# Patient Record
Sex: Female | Born: 1985 | Race: White | Hispanic: No | Marital: Married | Smoking: Never smoker
Health system: Southern US, Community
[De-identification: ages and names within clinical notes are randomized; demographics above are authoritative.]

## PROBLEM LIST (undated history)

## (undated) DIAGNOSIS — Z1589 Genetic susceptibility to other disease: Secondary | ICD-10-CM

## (undated) DIAGNOSIS — Z789 Other specified health status: Secondary | ICD-10-CM

## (undated) HISTORY — PX: LASIK: SHX215

## (undated) HISTORY — PX: WISDOM TOOTH EXTRACTION: SHX21

## (undated) HISTORY — DX: Genetic susceptibility to other disease: Z15.89

## (undated) HISTORY — PX: BREAST ENHANCEMENT SURGERY: SHX7

---

## 2015-03-23 NOTE — L&D Delivery Note (Addendum)
Delivery Note At 3:07 AM a viable and healthy female was delivered via Vaginal, Spontaneous Delivery (Presentation:OA, vtx ;  ).  APGAR: 9, 9; weight 7 lb 9.3 oz (3440 g).   Placenta status:spontaneous, intact , not sent.  Cord: short  with the following complications: none.  Cord pH: none  Anesthesia:  epidural Episiotomy: Median done after quarter size superficial skin separation noted above anus Lacerations:  Partial 3rd degree; left Vaginal sulcus Repair of rectus sphincter muscle with 0-vicryl figure of  Eight sutures x 4 . The separation repaired with 4-0 vicryl subcuticular reinforce with interrupted 4-0 vicryl sutures  Suture Repair: 3.0 chromic vicryl 0- vicryl, 4-0 vicryl Est. Blood Loss (mL): 250  Mom to postpartum.  Baby to Couplet care / Skin to Skin.  Demaria Deeney A 12/07/2015, 5:01 AM

## 2015-11-28 ENCOUNTER — Encounter (HOSPITAL_COMMUNITY): Payer: Self-pay | Admitting: *Deleted

## 2015-11-28 ENCOUNTER — Inpatient Hospital Stay (HOSPITAL_COMMUNITY)
Admission: AD | Admit: 2015-11-28 | Discharge: 2015-11-28 | Disposition: A | Discharge status: Home / Self Care | Payer: BLUE CROSS/BLUE SHIELD | Source: Ambulatory Visit | Attending: Obstetrics & Gynecology | Admitting: Obstetrics & Gynecology

## 2015-11-28 DIAGNOSIS — O368130 Decreased fetal movements, third trimester, not applicable or unspecified: Secondary | ICD-10-CM | POA: Diagnosis not present

## 2015-11-28 DIAGNOSIS — Z3A38 38 weeks gestation of pregnancy: Secondary | ICD-10-CM | POA: Diagnosis present

## 2015-11-28 NOTE — MAU Provider Note (Signed)
  [facility]N MAU Attending Note  Subjective:  30 y.o. G1P0 at 2154w2d presents to MAU today for "decreased fetal movement". Was seen in office recently and was instructed on how to do kick counts; did not get 10 movements in 2 hours and she called the office. She was told to come here. ON evaluation, she reports adequate fetal movement, leaking of fluid, no contractions, and no vaginal bleeding,    Objective:  Blood pressure 109/73, pulse 80, temperature 98.2 F (36.8 C), temperature source Oral, resp. rate 16.  FHR tracing: 140 bpm, +moderate variability, +accels, no decels Toco: No contractions GENERAL: Well-developed, well-nourished female in no acute distress.  HEENT: Normocephalic, atraumatic.   LUNGS: Normal respiratory effort HEART: Regular rate noted SKIN: Warm, dry and without erythema ABDOMEN: Soft, nondistended, nontender PSYCH: Normal mood and affect   Assessment & Plan:  IUP at 7954w2d Reactive NST, patient reassured Also reassured about adequate fetal movements, kick counts are a guide for when movement is noted to be decreased.  Labor and fetal movement precautions advised. Discharged to home in stable condition and follow up in office as scheduled. Dr. Billy Coastaavon, [facility] Attending on Call, was notified about this patient's evaluation and discharge from MAU.    Rebekah CollinsUGONNA  Rebekah Manrique, MD, FACOG Attending Obstetrician & Gynecologist Faculty Practice, [facility] [place]

## 2015-11-28 NOTE — MAU Note (Signed)
Pt was seen in the office today and said she was measuring 34 weeks by fundal height her provider told her to keep up with movement and she started counting and got less than 10 kicks in two hours and was told to come in.  Denies LOF/VB.

## 2015-11-28 NOTE — Discharge Instructions (Signed)
Fetal Movement Counts  Patient Name: __________________________________________________ Patient Due Date: ____________________  Performing a fetal movement count is highly recommended in high-risk pregnancies, but it is good for every pregnant woman to do. Your health care provider may ask you to start counting fetal movements at 28 weeks of the pregnancy. Fetal movements often increase:  · After eating a full meal.  · After physical activity.  · After eating or drinking something sweet or cold.  · At rest.  Pay attention to when you feel the baby is most active. This will help you notice a pattern of your baby's sleep and wake cycles and what factors contribute to an increase in fetal movement. It is important to perform a fetal movement count at the same time each day when your baby is normally most active.   HOW TO COUNT FETAL MOVEMENTS  1. Find a quiet and comfortable area to sit or lie down on your left side. Lying on your left side provides the best blood and oxygen circulation to your baby.  2. Write down the day and time on a sheet of paper or in a journal.  3. Start counting kicks, flutters, swishes, rolls, or jabs in a 2-hour period. You should feel at least 10 movements within 2 hours.  4. If you do not feel 10 movements in 2 hours, wait 2-3 hours and count again. Look for a change in the pattern or not enough counts in 2 hours.  SEEK MEDICAL CARE IF:  · You feel less than 10 counts in 2 hours, tried twice.  · There is no movement in over an hour.  · The pattern is changing or taking longer each day to reach 10 counts in 2 hours.  · You feel the baby is not moving as he or she usually does.  Date: ____________ Movements: ____________ Start time: ____________ Finish time: ____________   Date: ____________ Movements: ____________ Start time: ____________ Finish time: ____________  Date: ____________ Movements: ____________ Start time: ____________ Finish time: ____________  Date: ____________ Movements:  ____________ Start time: ____________ Finish time: ____________  Date: ____________ Movements: ____________ Start time: ____________ Finish time: ____________  Date: ____________ Movements: ____________ Start time: ____________ Finish time: ____________  Date: ____________ Movements: ____________ Start time: ____________ Finish time: ____________  Date: ____________ Movements: ____________ Start time: ____________ Finish time: ____________   Date: ____________ Movements: ____________ Start time: ____________ Finish time: ____________  Date: ____________ Movements: ____________ Start time: ____________ Finish time: ____________  Date: ____________ Movements: ____________ Start time: ____________ Finish time: ____________  Date: ____________ Movements: ____________ Start time: ____________ Finish time: ____________  Date: ____________ Movements: ____________ Start time: ____________ Finish time: ____________  Date: ____________ Movements: ____________ Start time: ____________ Finish time: ____________  Date: ____________ Movements: ____________ Start time: ____________ Finish time: ____________   Date: ____________ Movements: ____________ Start time: ____________ Finish time: ____________  Date: ____________ Movements: ____________ Start time: ____________ Finish time: ____________  Date: ____________ Movements: ____________ Start time: ____________ Finish time: ____________  Date: ____________ Movements: ____________ Start time: ____________ Finish time: ____________  Date: ____________ Movements: ____________ Start time: ____________ Finish time: ____________  Date: ____________ Movements: ____________ Start time: ____________ Finish time: ____________  Date: ____________ Movements: ____________ Start time: ____________ Finish time: ____________   Date: ____________ Movements: ____________ Start time: ____________ Finish time: ____________  Date: ____________ Movements: ____________ Start time: ____________ Finish  time: ____________  Date: ____________ Movements: ____________ Start time: ____________ Finish time: ____________  Date: ____________ Movements: ____________ Start time:   ____________ Finish time: ____________  Date: ____________ Movements: ____________ Start time: ____________ Finish time: ____________  Date: ____________ Movements: ____________ Start time: ____________ Finish time: ____________  Date: ____________ Movements: ____________ Start time: ____________ Finish time: ____________   Date: ____________ Movements: ____________ Start time: ____________ Finish time: ____________  Date: ____________ Movements: ____________ Start time: ____________ Finish time: ____________  Date: ____________ Movements: ____________ Start time: ____________ Finish time: ____________  Date: ____________ Movements: ____________ Start time: ____________ Finish time: ____________  Date: ____________ Movements: ____________ Start time: ____________ Finish time: ____________  Date: ____________ Movements: ____________ Start time: ____________ Finish time: ____________  Date: ____________ Movements: ____________ Start time: ____________ Finish time: ____________   Date: ____________ Movements: ____________ Start time: ____________ Finish time: ____________  Date: ____________ Movements: ____________ Start time: ____________ Finish time: ____________  Date: ____________ Movements: ____________ Start time: ____________ Finish time: ____________  Date: ____________ Movements: ____________ Start time: ____________ Finish time: ____________  Date: ____________ Movements: ____________ Start time: ____________ Finish time: ____________  Date: ____________ Movements: ____________ Start time: ____________ Finish time: ____________  Date: ____________ Movements: ____________ Start time: ____________ Finish time: ____________   Date: ____________ Movements: ____________ Start time: ____________ Finish time: ____________  Date: ____________  Movements: ____________ Start time: ____________ Finish time: ____________  Date: ____________ Movements: ____________ Start time: ____________ Finish time: ____________  Date: ____________ Movements: ____________ Start time: ____________ Finish time: ____________  Date: ____________ Movements: ____________ Start time: ____________ Finish time: ____________  Date: ____________ Movements: ____________ Start time: ____________ Finish time: ____________  Date: ____________ Movements: ____________ Start time: ____________ Finish time: ____________   Date: ____________ Movements: ____________ Start time: ____________ Finish time: ____________  Date: ____________ Movements: ____________ Start time: ____________ Finish time: ____________  Date: ____________ Movements: ____________ Start time: ____________ Finish time: ____________  Date: ____________ Movements: ____________ Start time: ____________ Finish time: ____________  Date: ____________ Movements: ____________ Start time: ____________ Finish time: ____________  Date: ____________ Movements: ____________ Start time: ____________ Finish time: ____________     This information is not intended to replace advice given to you by your health care provider. Make sure you discuss any questions you have with your health care provider.     Document Released: 04/07/2006 Document Revised: 03/29/2014 Document Reviewed: 01/03/2012  Elsevier Interactive Patient Education ©2016 [employer]

## 2015-12-06 ENCOUNTER — Encounter (HOSPITAL_COMMUNITY): Payer: Self-pay | Admitting: *Deleted

## 2015-12-06 ENCOUNTER — Inpatient Hospital Stay (HOSPITAL_COMMUNITY)
Admission: AD | Admit: 2015-12-06 | Discharge: 2015-12-08 | DRG: 775 | Disposition: A | Discharge status: Home / Self Care | Payer: BLUE CROSS/BLUE SHIELD | Source: Ambulatory Visit | Attending: Obstetrics and Gynecology | Admitting: Obstetrics and Gynecology

## 2015-12-06 ENCOUNTER — Inpatient Hospital Stay (HOSPITAL_COMMUNITY): Payer: BLUE CROSS/BLUE SHIELD | Admitting: Anesthesiology

## 2015-12-06 DIAGNOSIS — IMO0001 Reserved for inherently not codable concepts without codable children: Secondary | ICD-10-CM

## 2015-12-06 DIAGNOSIS — O26893 Other specified pregnancy related conditions, third trimester: Secondary | ICD-10-CM | POA: Diagnosis present

## 2015-12-06 DIAGNOSIS — O4292 Full-term premature rupture of membranes, unspecified as to length of time between rupture and onset of labor: Principal | ICD-10-CM | POA: Diagnosis present

## 2015-12-06 DIAGNOSIS — Z6791 Unspecified blood type, Rh negative: Secondary | ICD-10-CM

## 2015-12-06 DIAGNOSIS — Z3A39 39 weeks gestation of pregnancy: Secondary | ICD-10-CM

## 2015-12-06 HISTORY — DX: Other specified health status: Z78.9

## 2015-12-06 LAB — POCT FERN TEST: POCT FERN TEST: POSITIVE

## 2015-12-06 LAB — CBC
HEMATOCRIT: 33.9 % — AB (ref 36.0–46.0)
Hemoglobin: 11.6 g/dL — ABNORMAL LOW (ref 12.0–15.0)
MCH: 29.3 pg (ref 26.0–34.0)
MCHC: 34.2 g/dL (ref 30.0–36.0)
MCV: 85.6 fL (ref 78.0–100.0)
PLATELETS: 163 10*3/uL (ref 150–400)
RBC: 3.96 MIL/uL (ref 3.87–5.11)
RDW: 13.8 % (ref 11.5–15.5)
WBC: 7.6 10*3/uL (ref 4.0–10.5)

## 2015-12-06 MED ORDER — LACTATED RINGERS IV SOLN: INTRAVENOUS | Status: DC

## 2015-12-06 MED ORDER — OXYTOCIN 40 UNITS IN LACTATED RINGERS INFUSION - SIMPLE MED
2.5000 [IU]/h | INTRAVENOUS | Status: DC
Start: 2015-12-06 — End: 2015-12-07
  Filled 2015-12-06: qty 1000

## 2015-12-06 MED ORDER — TERBUTALINE SULFATE 1 MG/ML IJ SOLN
0.2500 mg | Freq: Once | INTRAMUSCULAR | Status: DC | PRN
  Filled 2015-12-06: qty 1

## 2015-12-06 MED ORDER — ONDANSETRON HCL 4 MG/2ML IJ SOLN: 4.0000 mg | Freq: Four times a day (QID) | INTRAMUSCULAR | Status: DC | PRN

## 2015-12-06 MED ORDER — FLEET ENEMA 7-19 GM/118ML RE ENEM: 1.0000 | ENEMA | RECTAL | Status: DC | PRN

## 2015-12-06 MED ORDER — OXYTOCIN 40 UNITS IN LACTATED RINGERS INFUSION - SIMPLE MED: 2.5000 [IU]/h | INTRAVENOUS | Status: DC

## 2015-12-06 MED ORDER — OXYTOCIN BOLUS FROM INFUSION: 500.0000 mL | Freq: Once | INTRAVENOUS | Status: DC

## 2015-12-06 MED ORDER — OXYCODONE-ACETAMINOPHEN 5-325 MG PO TABS: 1.0000 | ORAL_TABLET | ORAL | Status: DC | PRN

## 2015-12-06 MED ORDER — ACETAMINOPHEN 325 MG PO TABS: 650.0000 mg | ORAL_TABLET | ORAL | Status: DC | PRN

## 2015-12-06 MED ORDER — FENTANYL 2.5 MCG/ML BUPIVACAINE 1/10 % EPIDURAL INFUSION (WH - ANES)
14.0000 mL/h | INTRAMUSCULAR | Status: DC | PRN
  Administered 2015-12-06 (×2): 14 mL/h via EPIDURAL

## 2015-12-06 MED ORDER — LACTATED RINGERS IV SOLN: 500.0000 mL | INTRAVENOUS | Status: DC | PRN

## 2015-12-06 MED ORDER — SOD CITRATE-CITRIC ACID 500-334 MG/5ML PO SOLN: 30.0000 mL | ORAL | Status: DC | PRN

## 2015-12-06 MED ORDER — LIDOCAINE HCL (PF) 1 % IJ SOLN
30.0000 mL | INTRAMUSCULAR | Status: DC | PRN
  Filled 2015-12-06: qty 30

## 2015-12-06 MED ORDER — EPHEDRINE 5 MG/ML INJ
10.0000 mg | INTRAVENOUS | Status: DC | PRN
  Filled 2015-12-06: qty 4

## 2015-12-06 MED ORDER — LACTATED RINGERS IV SOLN
INTRAVENOUS | Status: DC
  Administered 2015-12-06: 20:00:00 via INTRAVENOUS

## 2015-12-06 MED ORDER — PHENYLEPHRINE 40 MCG/ML (10ML) SYRINGE FOR IV PUSH (FOR BLOOD PRESSURE SUPPORT)
80.0000 ug | PREFILLED_SYRINGE | INTRAVENOUS | Status: DC | PRN
  Filled 2015-12-06: qty 5

## 2015-12-06 MED ORDER — OXYCODONE-ACETAMINOPHEN 5-325 MG PO TABS: 2.0000 | ORAL_TABLET | ORAL | Status: DC | PRN

## 2015-12-06 MED ORDER — DIPHENHYDRAMINE HCL 50 MG/ML IJ SOLN: 12.5000 mg | INTRAMUSCULAR | Status: DC | PRN

## 2015-12-06 MED ORDER — PHENYLEPHRINE 40 MCG/ML (10ML) SYRINGE FOR IV PUSH (FOR BLOOD PRESSURE SUPPORT)
PREFILLED_SYRINGE | INTRAVENOUS | Status: AC
  Filled 2015-12-06: qty 20

## 2015-12-06 MED ORDER — FENTANYL 2.5 MCG/ML BUPIVACAINE 1/10 % EPIDURAL INFUSION (WH - ANES)
INTRAMUSCULAR | Status: AC
  Filled 2015-12-06: qty 125

## 2015-12-06 MED ORDER — OXYTOCIN 10 UNIT/ML IJ SOLN: 10.0000 [IU] | Freq: Once | INTRAMUSCULAR | Status: DC

## 2015-12-06 MED ORDER — LACTATED RINGERS IV SOLN: 500.0000 mL | Freq: Once | INTRAVENOUS | Status: DC

## 2015-12-06 MED ORDER — LIDOCAINE HCL (PF) 1 % IJ SOLN
INTRAMUSCULAR | Status: DC | PRN
  Administered 2015-12-06 (×2): 7 mL via EPIDURAL

## 2015-12-06 MED ORDER — OXYTOCIN 40 UNITS IN LACTATED RINGERS INFUSION - SIMPLE MED
1.0000 m[IU]/min | INTRAVENOUS | Status: DC
  Administered 2015-12-06: 2 m[IU]/min via INTRAVENOUS

## 2015-12-06 NOTE — Anesthesia Pain Management Evaluation Note (Signed)
  [facility] Pain Management Visit Note  Patient: Rebekah Mclaughlin, 30 y.o., female  "Hello I am a member of the anesthesia team at [facility]. We have an anesthesia team available at all times to provide care throughout the hospital, including epidural management and anesthesia for C-section. I don't know your plan for the delivery whether it a natural birth, water birth, IV sedation, nitrous supplementation, doula or epidural, but we want to meet your pain goals."   1.Was your pain managed to your expectations on prior hospitalizations?   No prior hospitalizations  2.What is your expectation for pain management during this hospitalization?     Epidural  3.How can we help you reach that goal? Epidural when possible.  Record the patient's initial score and the patient's pain goal.   Pain: 1  Pain Goal: 5 [facility] wants you to be able to say your pain was always managed very well.  Samauri Kellenberger 12/06/2015

## 2015-12-06 NOTE — Progress Notes (Signed)
Dr. Cherly Hensenousins notified of pt in MAU.  Notified that pt is a G1P0 at 2144w3d.  Notified that pt came in for leaking of fluid.  Notified that pt is fern positive, 40, 80, -2, vertex, and GBS negative.  Provider states to put in routine admission orders.

## 2015-12-06 NOTE — MAU Note (Signed)
Pt states that about 5 hours ago she started leaking fluid.  Pt states it has been happening on and off ever since.  Pt states she has been feeling the baby move.  Pt states she is not contracting but is having some occasional cramps.

## 2015-12-06 NOTE — Anesthesia Preprocedure Evaluation (Signed)

## 2015-12-06 NOTE — H&P (Signed)
Rebekah Mclaughlin is a 30 y.o. female presenting@ term gestation with SROM clear fluid and labor OB History    Gravida Para Term Preterm AB Living   1             SAB TAB Ectopic Multiple Live Births                 Past Medical History:  Diagnosis Date  . Medical history non-contributory    Past Surgical History:  Procedure Laterality Date  . LASIK    . WISDOM TOOTH EXTRACTION     Family History: family history is not on file. Social History:  reports that she has never smoked. She has never used smokeless tobacco. She reports that she does not drink alcohol or use drugs.     Maternal Diabetes: No Genetic Screening: Normal Maternal Ultrasounds/Referrals: Normal Fetal Ultrasounds or other Referrals:  None Maternal Substance Abuse:  No Significant Maternal Medications:  None Significant Maternal Lab Results:  Lab values include: Group B Strep negative, Rh negative Other Comments:  FOB's brother with recessive trait( ? Disorder)  Review of Systems  All other systems reviewed and are negative.  Maternal Medical History:  Reason for admission: Rupture of membranes and contractions.   Contractions: Frequency: irregular and rare.    Fetal activity: Perceived fetal activity is normal.    Prenatal complications: no prenatal complications Prenatal Complications - Diabetes: none.    Dilation: 4 Effacement (%): 70 Station: -2 Exam by:: Javel Hersh  Blood pressure 119/77, pulse 66, temperature 98.5 F (36.9 C), temperature source Oral, resp. rate 16, height 5\' 10"  (1.778 m), weight 78 kg (172 lb). Maternal Exam:  Uterine Assessment: Contraction strength is mild.  Abdomen: Patient reports no abdominal tenderness. Fetal presentation: vertex  Introitus: Normal vulva. Ferning test: positive.  Amniotic fluid character: clear.  Pelvis: adequate for delivery.      Physical Exam  Constitutional: She is oriented to person, place, and time. She appears well-developed and  well-nourished.  HENT:  Head: Atraumatic.  Eyes: EOM are normal.  Neck: Neck supple.  Cardiovascular: Regular rhythm.   GI: Soft.  Musculoskeletal: She exhibits no edema.  Neurological: She is alert and oriented to person, place, and time.  Skin: Skin is warm and dry.  Psychiatric: She has a normal mood and affect.    Prenatal labs: ABO, Rh: --/--/O NEG (09/16 1956) Antibody: POS (09/16 1955) Rubella:  Immune RPR:  NR  HBsAg:   neg HIV:   neg GBS:   neg  Assessment/Plan: Labor RH negative SROM GBS cx neg P) admit. Routine labs. Pitocin. Analgesic prn   Rebekah Mclaughlin A 12/06/2015, 9:34 PM

## 2015-12-06 NOTE — Anesthesia Procedure Notes (Addendum)
Epidural Patient location during procedure: OB Start time: 12/06/2015 10:43 PM End time: 12/06/2015 10:47 PM  Staffing Anesthesiologist: Leilani AbleHATCHETT, Mirela Parsley Performed: anesthesiologist   Preanesthetic Checklist Completed: patient identified, surgical consent, pre-op evaluation, timeout performed, IV checked, risks and benefits discussed and monitors and equipment checked  Epidural Patient position: sitting Prep: site prepped and draped and DuraPrep Patient monitoring: continuous pulse ox and blood pressure Approach: midline Location: L3-L4 Injection technique: LOR air  Needle:  Needle type: Tuohy  Needle gauge: 17 G Needle length: 9 cm and 9 Needle insertion depth: 4 cm Catheter type: closed end flexible Catheter size: 19 Gauge Catheter at skin depth: 9 cm Test dose: negative and Other  Assessment Sensory level: T10 Events: blood not aspirated, injection not painful, no injection resistance, negative IV test and no paresthesia  Additional Notes Reason for block:procedure for pain

## 2015-12-07 ENCOUNTER — Encounter (HOSPITAL_COMMUNITY): Payer: Self-pay

## 2015-12-07 LAB — COMPREHENSIVE METABOLIC PANEL
ALBUMIN: 2.6 g/dL — AB (ref 3.5–5.0)
ALT: 15 U/L (ref 14–54)
ANION GAP: 6 (ref 5–15)
AST: 30 U/L (ref 15–41)
Alkaline Phosphatase: 163 U/L — ABNORMAL HIGH (ref 38–126)
BILIRUBIN TOTAL: 0.6 mg/dL (ref 0.3–1.2)
BUN: 10 mg/dL (ref 6–20)
CO2: 24 mmol/L (ref 22–32)
Calcium: 8.2 mg/dL — ABNORMAL LOW (ref 8.9–10.3)
Chloride: 106 mmol/L (ref 101–111)
Creatinine, Ser: 0.58 mg/dL (ref 0.44–1.00)
GFR calc Af Amer: >60 mL/min (ref >60)
GFR calc non Af Amer: >60 mL/min (ref >60)
GLUCOSE: 97 mg/dL (ref 65–99)
POTASSIUM: 3.2 mmol/L — AB (ref 3.5–5.1)
SODIUM: 136 mmol/L (ref 135–145)
Total Protein: 5.7 g/dL — ABNORMAL LOW (ref 6.5–8.1)

## 2015-12-07 LAB — CBC
HEMATOCRIT: 32.9 % — AB (ref 36.0–46.0)
HEMOGLOBIN: 11.3 g/dL — AB (ref 12.0–15.0)
MCH: 29.2 pg (ref 26.0–34.0)
MCHC: 34.3 g/dL (ref 30.0–36.0)
MCV: 85 fL (ref 78.0–100.0)
Platelets: 161 10*3/uL (ref 150–400)
RBC: 3.87 MIL/uL (ref 3.87–5.11)
RDW: 13.7 % (ref 11.5–15.5)
WBC: 14.9 10*3/uL — AB (ref 4.0–10.5)

## 2015-12-07 LAB — URIC ACID: Uric Acid, Serum: 4.9 mg/dL (ref 2.3–6.6)

## 2015-12-07 LAB — RPR: RPR Ser Ql: NONREACTIVE

## 2015-12-07 MED ORDER — ACETAMINOPHEN 325 MG PO TABS: 650.0000 mg | ORAL_TABLET | ORAL | Status: DC | PRN

## 2015-12-07 MED ORDER — ONDANSETRON HCL 4 MG/2ML IJ SOLN: 4.0000 mg | INTRAMUSCULAR | Status: DC | PRN

## 2015-12-07 MED ORDER — WITCH HAZEL-GLYCERIN EX PADS: 1.0000 "application " | MEDICATED_PAD | CUTANEOUS | Status: DC | PRN

## 2015-12-07 MED ORDER — SENNOSIDES-DOCUSATE SODIUM 8.6-50 MG PO TABS
2.0000 | ORAL_TABLET | ORAL | Status: DC
  Administered 2015-12-08: 2 via ORAL
  Filled 2015-12-07 (×2): qty 2

## 2015-12-07 MED ORDER — ONDANSETRON HCL 4 MG PO TABS: 4.0000 mg | ORAL_TABLET | ORAL | Status: DC | PRN

## 2015-12-07 MED ORDER — POLYETHYLENE GLYCOL 3350 17 G PO PACK
17.0000 g | PACK | Freq: Every day | ORAL | Status: DC
  Administered 2015-12-07 – 2015-12-08 (×2): 17 g via ORAL
  Filled 2015-12-07 (×4): qty 1

## 2015-12-07 MED ORDER — IBUPROFEN 600 MG PO TABS
600.0000 mg | ORAL_TABLET | Freq: Four times a day (QID) | ORAL | Status: DC
  Administered 2015-12-07: 600 mg via ORAL
  Filled 2015-12-07: qty 1

## 2015-12-07 MED ORDER — OXYCODONE HCL 5 MG PO TABS: 10.0000 mg | ORAL_TABLET | ORAL | Status: DC | PRN

## 2015-12-07 MED ORDER — DIPHENHYDRAMINE HCL 25 MG PO CAPS: 25.0000 mg | ORAL_CAPSULE | Freq: Four times a day (QID) | ORAL | Status: DC | PRN

## 2015-12-07 MED ORDER — DOCUSATE SODIUM 100 MG PO CAPS
100.0000 mg | ORAL_CAPSULE | Freq: Two times a day (BID) | ORAL | Status: DC
  Administered 2015-12-07 – 2015-12-08 (×3): 100 mg via ORAL
  Filled 2015-12-07 (×3): qty 1

## 2015-12-07 MED ORDER — IBUPROFEN 800 MG PO TABS
800.0000 mg | ORAL_TABLET | Freq: Three times a day (TID) | ORAL | Status: DC
  Administered 2015-12-07 – 2015-12-08 (×3): 800 mg via ORAL
  Filled 2015-12-07 (×3): qty 1

## 2015-12-07 MED ORDER — COCONUT OIL OIL
1.0000 "application " | TOPICAL_OIL | Status: DC | PRN
  Administered 2015-12-07: 1 via TOPICAL
  Filled 2015-12-07: qty 120

## 2015-12-07 MED ORDER — ERYTHROMYCIN 5 MG/GM OP OINT
TOPICAL_OINTMENT | OPHTHALMIC | Status: AC
  Filled 2015-12-07: qty 1

## 2015-12-07 MED ORDER — ZOLPIDEM TARTRATE 5 MG PO TABS: 5.0000 mg | ORAL_TABLET | Freq: Every evening | ORAL | Status: DC | PRN

## 2015-12-07 MED ORDER — DIBUCAINE 1 % RE OINT: 1.0000 "application " | TOPICAL_OINTMENT | RECTAL | Status: DC | PRN

## 2015-12-07 MED ORDER — OXYCODONE HCL 5 MG PO TABS: 5.0000 mg | ORAL_TABLET | ORAL | Status: DC | PRN

## 2015-12-07 MED ORDER — BENZOCAINE-MENTHOL 20-0.5 % EX AERO
1.0000 "application " | INHALATION_SPRAY | CUTANEOUS | Status: DC | PRN
  Administered 2015-12-07: 1 via TOPICAL
  Filled 2015-12-07: qty 56

## 2015-12-07 MED ORDER — PRENATAL MULTIVITAMIN CH
1.0000 | ORAL_TABLET | Freq: Every day | ORAL | Status: DC
  Administered 2015-12-07 – 2015-12-08 (×2): 1 via ORAL
  Filled 2015-12-07 (×2): qty 1

## 2015-12-07 MED ORDER — SIMETHICONE 80 MG PO CHEW: 80.0000 mg | CHEWABLE_TABLET | ORAL | Status: DC | PRN

## 2015-12-07 NOTE — Anesthesia Postprocedure Evaluation (Signed)
Anesthesia Post Note  Patient: Rebekah RaddleCarrie Dellis  Procedure(s) Performed: * No procedures listed *  Patient location during evaluation: Mother Baby Anesthesia Type: Epidural Level of consciousness: awake, awake and alert, oriented and patient cooperative Pain management: pain level controlled Vital Signs Assessment: post-procedure vital signs reviewed and stable Respiratory status: spontaneous breathing, nonlabored ventilation and respiratory function stable Cardiovascular status: stable Postop Assessment: no headache, no backache, no signs of nausea or vomiting, patient able to bend at knees and epidural receding Anesthetic complications: no     Last Vitals:  Vitals:   12/07/15 0545 12/07/15 0655  BP: 108/65 114/72  Pulse: 78 70  Resp: 16 16  Temp: 37 C 36.8 C    Last Pain:  Vitals:   12/07/15 0655  TempSrc: Oral  PainSc: 1    Pain Goal:                 Rube Sanchez L

## 2015-12-07 NOTE — Lactation Note (Signed)
This note was copied from a baby's chart. Lactation Consultation Note  Patient Name: Rebekah [name] ZOXWR'UToday's Date: 12/07/2015 Reason for consult: Follow-up assessment   Follow up for feeding assessment at mom's request. Mom is concerned about breast augmentation, inadequate breast tissue, and tongue tie. Rebekah Mclaughlin was STS with mom when I went into the room. He was not cueing to feed. We awakened him and assisted mom in latching him to the right breast in the cross cradle hold. Mom reports she was trying to latch him in the cradle hold, we reviewed importance of positioning to obtain a deep latch. [name] did latch after about 10 minutes and fed for 10 minutes and was still feeding when I left the room.   Mom with small breasts with breast augmentation history. Mom reports she had minimal changes with pregnancy. She is pumping and noted that she received a gtt of colostrum from the left breast earlier. 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 I was not able to visualize tongue tie but did note that Rebekah Mclaughlin extended his tongue over gumline and lip. 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 Mom noted that BF was tender and painful at times, showed her how to flange upper and lower lips. She reported that it felt better. Discussed trying NS if pain persists or not able to tolerate infant at breast.   Mom is pumping every 3 hours to stimulate supply, She was pleased to see a gtt of colostrum. Told her we would need to follow [name]'s weights closely and only time will tell if she will make an adequate supply. Enc her to maintain pumping after BF. Follow up tomorrow and prn.      Maternal Data Formula Feeding for Exclusion: No Has patient been taught Hand Expression?: Yes Does the patient have breastfeeding experience prior to this delivery?: No  Feeding Feeding Type: Breast Fed Length of feed: 10 min (Still feeding when I left the room)  LATCH  Score/Interventions Latch: Repeated attempts needed to sustain latch, nipple held in mouth throughout feeding, stimulation needed to elicit sucking reflex. Intervention(s): Adjust position;Assist with latch;Breast massage;Breast compression  Audible Swallowing: None  Type of Nipple: Everted at rest and after stimulation  Comfort (Breast/Nipple): Filling, red/small blisters or bruises, mild/mod discomfort  Problem noted: Mild/Moderate discomfort Interventions (Mild/moderate discomfort): Reverse pressue (flange lips)  Hold (Positioning): Assistance needed to correctly position infant at breast and maintain latch. Intervention(s): Breastfeeding basics reviewed;Support Pillows;Position options;Skin to skin  LATCH Score: 5  Lactation Tools Discussed/Used WIC Program: No Pump Review: Setup, frequency, and cleaning;Milk Storage Initiated by:: DW Date initiated:: 12/07/15   Consult Status Consult Status: Follow-up Date: 12/08/15 Follow-up type: In-patient    Silas FloodSharon S Mysti Haley 12/07/2015, 6:56 PM

## 2015-12-07 NOTE — Progress Notes (Signed)
Rebekah Mclaughlin is a 30 y.o. G1P0 at 231w4d by LMP admitted for active labor, rupture of membranes  Subjective: Chief Complaint  Patient presents with  . Rupture of Membranes    Objective: VS: BP 101/53 98 FHT:  FHR: 140 bpm, variability: moderate,  accelerations:  Present,  decelerations:  Absent UC:   regular, every 2-3 minutes SVE:   10 cm dilated, 100% effaced, +2 station Tracing: cat 1  Labs: Lab Results  Component Value Date   WBC 7.6 12/06/2015   HGB 11.6 (L) 12/06/2015   HCT 33.9 (L) 12/06/2015   MCV 85.6 12/06/2015   PLT 163 12/06/2015    Assessment / Plan: Augmentation of labor, progressing well P) start pushing  Anticipated MOD:  NSVD  Rebekah Mclaughlin A 12/07/2015, 4:44 AM

## 2015-12-07 NOTE — Lactation Note (Signed)
This note was copied from a baby's chart. Lactation Consultation Note: initial visit with mom, baby now 12 hours. Mom reports she has had a couple of pretty good feedings, attempting now but too sleepy.   Mom had breast augmentation- incision underneath breast. Reports she had very little breast tissue before surgery. Reports breast changes during pregnancy. Unable to hand express any Colostrum.   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 Baby asleep so I did not assess tongue at this time. Encouraged to discuss with Pediatrician.   Offered DEBP to promote milk supply. Mom agreeable. DEBP set up for mom- reviewed setup, use and cleaning of pump parts. Has Medela pump for home. Mom pumping as I left room. Mom states she really wants to breast feed. BF brochure given to mom. Reviewed our phone number, OP appointments and BFSG as resources for support after DC. To call for assist prn  Patient Name: Rebekah Reinaldo RaddleCarrie Doren ONGEX'BToday's Date: 12/07/2015 Reason for consult: Initial assessment;Breast surgery (Breast augmentation)   Maternal Data Formula Feeding for Exclusion: No Has patient been taught Hand Expression?: Yes Does the patient have breastfeeding experience prior to this delivery?: No  Feeding    LATCH Score/Interventions                      Lactation Tools Discussed/Used WIC Program: No Pump Review: Setup, frequency, and cleaning Initiated by:: Rebekah Mclaughlin Date initiated:: 12/07/15   Consult Status Consult Status: Follow-up Date: 12/08/15 Follow-up type: In-patient    Rebekah Mclaughlin, Rebekah Mclaughlin 12/07/2015, 3:28 PM

## 2015-12-08 MED ORDER — RHO D IMMUNE GLOBULIN 1500 UNIT/2ML IJ SOSY
300.0000 ug | PREFILLED_SYRINGE | Freq: Once | INTRAMUSCULAR | Status: AC
Start: 2015-12-08 — End: 2015-12-08
  Administered 2015-12-08: 300 ug via INTRAVENOUS
  Filled 2015-12-08: qty 2

## 2015-12-08 NOTE — Discharge Summary (Signed)
OB Discharge Summary  Patient Name: Rebekah Mclaughlin DOB: July 15, 1985 MRN: [id number]  Date of admission: 12/06/2015 Delivering MD: COUSINS, SHERONETTE   Date of discharge: 12/08/2015  Admitting diagnosis: 39 WKS, WATER BROKE Intrauterine pregnancy: 3368w4d     Secondary diagnosis:Principal Problem:   Postpartum care following vaginal delivery w 3rd (9/17) Active Problems:   Active labor  Additional problems: 3rd degree laceration    Discharge diagnosis: Term Pregnancy Delivered                                                                     Post partum procedures:none  Augmentation: Pitocin  Complications: None  Hospital course:  Induction of Labor With Vaginal Delivery   30 y.o. yo G1P1001 at 6768w4d was admitted to the hospital 12/06/2015 for induction of labor.  Indication for induction: PROM.  Patient had an uncomplicated labor course as follows: Membrane Rupture Time/Date: 1:00 PM ,12/06/2015   Intrapartum Procedures: Episiotomy: Median [2]                                         Lacerations:  3rd degree [4];Sulcus [9];Perineal [11];Vaginal [6]  Patient had delivery of a Viable infant.  Information for the patient's newborn:  Rebekah Mclaughlin, Boy Rebekah Mclaughlin [[id number]][[id number]]  Delivery Method: Vaginal, Spontaneous Delivery (Filed from Delivery Summary)   12/07/2015  Details of delivery can be found in separate delivery note.  Patient had a routine postpartum course. Patient is discharged home 12/08/15.   Physical exam Vitals:   12/07/15 0655 12/07/15 1040 12/07/15 1819 12/08/15 0500  BP: 114/72 107/68 107/66 110/68  Pulse: 70 (!) 58 70 (!) 57  Resp: 16 18 18 18   Temp: 98.3 F (36.8 C) 98.3 F (36.8 C) 98.7 F (37.1 C) 98 F (36.7 C)  TempSrc: Oral Oral Oral Oral  SpO2: 98%     Weight:      Height:       General: alert Lochia: appropriate Uterine Fundus: firm Incision: N/A DVT Evaluation: No evidence of DVT seen on physical exam. Labs: Lab Results  Component Value Date   WBC  14.9 (H) 12/07/2015   HGB 11.3 (L) 12/07/2015   HCT 32.9 (L) 12/07/2015   MCV 85.0 12/07/2015   PLT 161 12/07/2015   CMP Latest Ref Rng & Units 12/07/2015  Glucose 65 - 99 mg/dL 97  BUN 6 - 20 mg/dL 10  Creatinine 7.820.44 - 9.561.00 mg/dL 2.130.58  Sodium 086135 - 578145 mmol/L 136  Potassium 3.5 - 5.1 mmol/L 3.2(L)  Chloride 101 - 111 mmol/L 106  CO2 22 - 32 mmol/L 24  Calcium 8.9 - 10.3 mg/dL 8.2(L)  Total Protein 6.5 - 8.1 g/dL 4.6(N5.7(L)  Total Bilirubin 0.3 - 1.2 mg/dL 0.6  Alkaline Phos 38 - 126 U/L 163(H)  AST 15 - 41 U/L 30  ALT 14 - 54 U/L 15    Discharge instruction: per After Visit Summary and "Baby and Me Booklet".  After Visit Meds: PNV, stool softener  Diet: routine diet  Activity: Advance as tolerated. Pelvic rest for 6 weeks.   Outpatient follow up:2 weeks Follow up Appt:No future appointments. Follow up visit:  No Follow-up on file.  Postpartum contraception: Not Discussed  Newborn Data: Live born female  Birth Weight: 7 lb 9.3 oz (3440 g) APGAR: 9, 9  Baby Feeding: Breast Disposition:home with mother   12/08/2015 Lendon Colonel., MD

## 2015-12-10 LAB — TYPE AND SCREEN
ABO/RH(D): O NEG
Antibody Screen: POSITIVE
DAT, IGG: NEGATIVE
UNIT DIVISION: 0
Unit division: 0

## 2015-12-10 LAB — RH IG WORKUP (INCLUDES ABO/RH)
ABO/RH(D): O NEG
Fetal Screen: NEGATIVE
Gestational Age(Wks): 39.4
UNIT DIVISION: 0

## 2015-12-17 ENCOUNTER — Other Ambulatory Visit: Payer: Self-pay | Admitting: Obstetrics

## 2015-12-18 ENCOUNTER — Ambulatory Visit (HOSPITAL_COMMUNITY): Payer: BLUE CROSS/BLUE SHIELD | Admitting: Anesthesiology

## 2015-12-18 ENCOUNTER — Encounter (HOSPITAL_COMMUNITY): Payer: Self-pay

## 2015-12-18 ENCOUNTER — Encounter (HOSPITAL_COMMUNITY)
Admission: RE | Disposition: A | Discharge status: Home / Self Care | Payer: Self-pay | Source: Ambulatory Visit | Attending: Obstetrics

## 2015-12-18 ENCOUNTER — Ambulatory Visit (HOSPITAL_COMMUNITY)
Admission: RE | Admit: 2015-12-18 | Discharge: 2015-12-18 | Disposition: A | Discharge status: Home / Self Care | Payer: BLUE CROSS/BLUE SHIELD | Source: Ambulatory Visit | Attending: Obstetrics | Admitting: Obstetrics

## 2015-12-18 DIAGNOSIS — O901 Disruption of perineal obstetric wound: Secondary | ICD-10-CM | POA: Insufficient documentation

## 2015-12-18 HISTORY — PX: PERINEOPLASTY: SHX2218

## 2015-12-18 LAB — CBC
HEMATOCRIT: 42.3 % (ref 36.0–46.0)
HEMOGLOBIN: 14.4 g/dL (ref 12.0–15.0)
MCH: 29.7 pg (ref 26.0–34.0)
MCHC: 34 g/dL (ref 30.0–36.0)
MCV: 87.2 fL (ref 78.0–100.0)
Platelets: 408 10*3/uL — ABNORMAL HIGH (ref 150–400)
RBC: 4.85 MIL/uL (ref 3.87–5.11)
RDW: 13.9 % (ref 11.5–15.5)
WBC: 8.9 10*3/uL (ref 4.0–10.5)

## 2015-12-18 SURGERY — PERINEOPLASTY
Anesthesia: Monitor Anesthesia Care | Site: Vagina

## 2015-12-18 MED ORDER — SUFENTANIL CITRATE 50 MCG/ML IV SOLN: INTRAVENOUS | Status: DC | PRN

## 2015-12-18 MED ORDER — CEFAZOLIN SODIUM-DEXTROSE 2-3 GM-% IV SOLR
INTRAVENOUS | Status: DC | PRN
  Administered 2015-12-18: 2 g via INTRAVENOUS

## 2015-12-18 MED ORDER — FENTANYL CITRATE (PF) 100 MCG/2ML IJ SOLN: 25.0000 ug | INTRAMUSCULAR | Status: DC | PRN

## 2015-12-18 MED ORDER — CHLOROPROCAINE HCL 1 % IJ SOLN
INTRAMUSCULAR | Status: AC
  Filled 2015-12-18: qty 30

## 2015-12-18 MED ORDER — BUPIVACAINE LIPOSOME 1.3 % IJ SUSP
20.0000 mL | Freq: Once | INTRAMUSCULAR | Status: AC
  Administered 2015-12-18: 10 mL
  Filled 2015-12-18: qty 20

## 2015-12-18 MED ORDER — KETOROLAC TROMETHAMINE 30 MG/ML IJ SOLN
INTRAMUSCULAR | Status: AC
  Filled 2015-12-18: qty 1

## 2015-12-18 MED ORDER — LIDOCAINE HCL 1 % IJ SOLN
INTRAMUSCULAR | Status: AC
  Filled 2015-12-18: qty 20

## 2015-12-18 MED ORDER — METOCLOPRAMIDE HCL 5 MG/ML IJ SOLN: 10.0000 mg | Freq: Once | INTRAMUSCULAR | Status: DC | PRN

## 2015-12-18 MED ORDER — SCOPOLAMINE 1 MG/3DAYS TD PT72
1.0000 | MEDICATED_PATCH | Freq: Once | TRANSDERMAL | Status: DC
  Administered 2015-12-18: 1.5 mg via TRANSDERMAL

## 2015-12-18 MED ORDER — HYDROCODONE-ACETAMINOPHEN 7.5-325 MG PO TABS: 1.0000 | ORAL_TABLET | Freq: Once | ORAL | Status: DC | PRN

## 2015-12-18 MED ORDER — LACTATED RINGERS IV SOLN
INTRAVENOUS | Status: DC
  Administered 2015-12-18: 125 mL/h via INTRAVENOUS
  Administered 2015-12-18: 14:00:00 via INTRAVENOUS

## 2015-12-18 MED ORDER — MEPERIDINE HCL 25 MG/ML IJ SOLN: 6.2500 mg | INTRAMUSCULAR | Status: DC | PRN

## 2015-12-18 MED ORDER — FENTANYL CITRATE (PF) 100 MCG/2ML IJ SOLN
INTRAMUSCULAR | Status: AC
  Filled 2015-12-18: qty 2

## 2015-12-18 MED ORDER — PROPOFOL 10 MG/ML IV BOLUS
INTRAVENOUS | Status: AC
  Filled 2015-12-18: qty 40

## 2015-12-18 MED ORDER — LIDOCAINE HCL (CARDIAC) 20 MG/ML IV SOLN
INTRAVENOUS | Status: AC
  Filled 2015-12-18: qty 5

## 2015-12-18 MED ORDER — DEXAMETHASONE SODIUM PHOSPHATE 4 MG/ML IJ SOLN
INTRAMUSCULAR | Status: DC | PRN
  Administered 2015-12-18: 4 mg via INTRAVENOUS

## 2015-12-18 MED ORDER — MIDAZOLAM HCL 2 MG/2ML IJ SOLN
INTRAMUSCULAR | Status: AC
  Filled 2015-12-18: qty 2

## 2015-12-18 MED ORDER — ONDANSETRON HCL 4 MG/2ML IJ SOLN
INTRAMUSCULAR | Status: DC | PRN
  Administered 2015-12-18: 4 mg via INTRAVENOUS

## 2015-12-18 MED ORDER — CEFAZOLIN SODIUM-DEXTROSE 2-4 GM/100ML-% IV SOLN
INTRAVENOUS | Status: AC
  Filled 2015-12-18: qty 100

## 2015-12-18 MED ORDER — IBUPROFEN 800 MG PO TABS: 800.0000 mg | ORAL_TABLET | Freq: Three times a day (TID) | ORAL | 3 refills | Status: DC | PRN

## 2015-12-18 MED ORDER — MIDAZOLAM HCL 2 MG/2ML IJ SOLN
INTRAMUSCULAR | Status: DC | PRN
  Administered 2015-12-18 (×2): 1 mg via INTRAVENOUS

## 2015-12-18 MED ORDER — OXYCODONE-ACETAMINOPHEN 5-325 MG PO TABS: 1.0000 | ORAL_TABLET | ORAL | 0 refills | Status: DC | PRN

## 2015-12-18 MED ORDER — LIDOCAINE HCL 1 % IJ SOLN
INTRAMUSCULAR | Status: DC | PRN
  Administered 2015-12-18: 10 mL

## 2015-12-18 MED ORDER — FENTANYL CITRATE (PF) 100 MCG/2ML IJ SOLN
INTRAMUSCULAR | Status: DC | PRN
  Administered 2015-12-18: 25 ug via INTRAVENOUS
  Administered 2015-12-18 (×2): 50 ug via INTRAVENOUS
  Administered 2015-12-18: 75 ug via INTRAVENOUS

## 2015-12-18 MED ORDER — KETOROLAC TROMETHAMINE 30 MG/ML IJ SOLN
INTRAMUSCULAR | Status: DC | PRN
  Administered 2015-12-18: 30 mg via INTRAVENOUS

## 2015-12-18 MED ORDER — LIDOCAINE HCL (CARDIAC) 20 MG/ML IV SOLN
INTRAVENOUS | Status: DC | PRN
  Administered 2015-12-18: 40 mg via INTRAVENOUS

## 2015-12-18 MED ORDER — PROPOFOL 500 MG/50ML IV EMUL
INTRAVENOUS | Status: DC | PRN
  Administered 2015-12-18: 30 mg via INTRAVENOUS
  Administered 2015-12-18: 20 mg via INTRAVENOUS
  Administered 2015-12-18: 30 mg via INTRAVENOUS
  Administered 2015-12-18 (×2): 50 mg via INTRAVENOUS
  Administered 2015-12-18 (×2): 20 mg via INTRAVENOUS
  Administered 2015-12-18 (×2): 30 mg via INTRAVENOUS

## 2015-12-18 MED ORDER — SCOPOLAMINE 1 MG/3DAYS TD PT72
MEDICATED_PATCH | TRANSDERMAL | Status: AC
  Filled 2015-12-18: qty 1

## 2015-12-18 SURGICAL SUPPLY — 25 items
BLADE SURG 15 STRL LF C SS BP (BLADE) ×1 IMPLANT
BLADE SURG 15 STRL SS (BLADE) ×2
CATH ROBINSON RED A/P 16FR (CATHETERS) ×3 IMPLANT
CLOTH BEACON ORANGE TIMEOUT ST (SAFETY) ×3 IMPLANT
COUNTER NEEDLE 1200 MAGNETIC (NEEDLE) ×3 IMPLANT
ELECT REM PT RETURN 9FT ADLT (ELECTROSURGICAL) ×3
ELECTRODE REM PT RTRN 9FT ADLT (ELECTROSURGICAL) ×1 IMPLANT
GLOVE BIO SURGEON STRL SZ 6.5 (GLOVE) ×2 IMPLANT
GLOVE BIO SURGEONS STRL SZ 6.5 (GLOVE) ×1
GLOVE BIOGEL PI IND STRL 7.0 (GLOVE) ×2 IMPLANT
GLOVE BIOGEL PI INDICATOR 7.0 (GLOVE) ×4
GOWN STRL REUS W/TWL LRG LVL3 (GOWN DISPOSABLE) ×6 IMPLANT
NEEDLE HYPO 22GX1.5 SAFETY (NEEDLE) ×6 IMPLANT
NS IRRIG 1000ML POUR BTL (IV SOLUTION) ×3 IMPLANT
PACK VAGINAL MINOR WOMEN LF (CUSTOM PROCEDURE TRAY) ×3 IMPLANT
PAD OB MATERNITY 4.3X12.25 (PERSONAL CARE ITEMS) ×3 IMPLANT
PAD PREP 24X48 CUFFED NSTRL (MISCELLANEOUS) ×3 IMPLANT
PENCIL BUTTON HOLSTER BLD 10FT (ELECTRODE) ×3 IMPLANT
SUT VICRYL 3 0 RAPIDE (SUTURE) ×3 IMPLANT
SUT VICRYL RAPIDE 3 0 (SUTURE) ×3 IMPLANT
SUT VICRYL RAPIDE 4/0 PS 2 (SUTURE) ×3 IMPLANT
SYR 20CC LL (SYRINGE) ×3 IMPLANT
SYR CONTROL 10ML LL (SYRINGE) ×3 IMPLANT
TOWEL OR 17X24 6PK STRL BLUE (TOWEL DISPOSABLE) ×6 IMPLANT
WATER STERILE IRR 1000ML POUR (IV SOLUTION) IMPLANT

## 2015-12-18 NOTE — H&P (Signed)
CC: perineal separation  HPI: G1P1, 11 days out from SVD who presented for perineal check and found to have a distal perineal separation. Pt notes no constipation or blood in stool. Has otherwise been recovering well. Breastfeeding.  Past Medical History:  Diagnosis Date  . Medical history non-contributory     Past Surgical History:  Procedure Laterality Date  . LASIK    . WISDOM TOOTH EXTRACTION      All: none Meds: PNV, iron  PE: Vitals:   12/18/15 1231  BP: 102/75  Pulse: (!) 108  Resp: 16  Temp: 98.4 F (36.9 C)  TempSrc: Oral  SpO2: 98%  Weight: 68 kg (150 lb)  Height: 5\' 10"  (1.778 m)   GeN: well appearing, no distress Abd: soft, NT GU: def to OR  CBC    Component Value Date/Time   WBC 8.9 12/18/2015 1215   RBC 4.85 12/18/2015 1215   HGB 14.4 12/18/2015 1215   HCT 42.3 12/18/2015 1215   PLT 408 (H) 12/18/2015 1215   MCV 87.2 12/18/2015 1215   MCH 29.7 12/18/2015 1215   MCHC 34.0 12/18/2015 1215   RDW 13.9 12/18/2015 1215    A/P: PP perineal separation, for reapproximation  Rebekah Mclaughlin A. 12/18/2015 1:18 PM

## 2015-12-18 NOTE — Brief Op Note (Signed)
12/18/2015  2:30 PM  PATIENT:  Rebekah Mclaughlin  30 y.o. female  PRE-OPERATIVE DIAGNOSIS:  PERINEAL LACERATION SEQUELA  POST-OPERATIVE DIAGNOSIS:  PERINEAL LACERATION SEQUELA  PROCEDURE:  Procedure(s): Perineal revision of laceration separation (N/A) Repair of perineal dehiscence   SURGEON:  Surgeon(s) and Role:    * Noland FordyceKelly Ruslan Mccabe, MD - Primary  PHYSICIAN ASSISTANT:   ASSISTANTS: none   ANESTHESIA:   local and MAC  EBL:  Total I/O In: 1400 [I.V.:1400] Out: 25 [Blood:25]  BLOOD ADMINISTERED:none  DRAINS: none   LOCAL MEDICATIONS USED:  LIDOCAINE  and OTHER Exparel  SPECIMEN:  No Specimen  DISPOSITION OF SPECIMEN:  N/A  COUNTS:  YES  TOURNIQUET:  * No tourniquets in log *  DICTATION: .Dragon Dictation  PLAN OF CARE: home after PACU  PATIENT DISPOSITION:  PACU - hemodynamically stable.   Delay start of Pharmacological VTE agent (>24hrs) due to surgical blood loss or risk of bleeding: yes

## 2015-12-18 NOTE — Discharge Instructions (Signed)
Episiotomy, Care After Refer to this sheet in the next few weeks. These instructions provide you with information on caring for yourself after your procedure. Your health care provider may also give you more specific instructions. Your treatment has been planned according to current medical practices, but problems sometimes occur. Call your health care provider if you have any problems or questions after your procedure. WHAT TO EXPECT AFTER THE PROCEDURE After your procedure, it is typical to have the following sensations:  Pain or aching around the episiotomy site.  Redness and mild swelling around the episiotomy site.  A tugging or pulling sensation at the episiotomy site from the stitches. HOME CARE INSTRUCTIONS   The first day, put ice on the episiotomy area.  Put ice in a plastic bag.  Place a towel between your skin and the bag.  Leave the ice on for 20 minutes, 2-3 times a day.  Bathe using a warm sitz bath as directed by your health care provider. This can speed up healing. Sitz baths can be performed in your bathtub or using a sitz bath kit that fits over your toilet.  Place 3-4 inches of warm water in your bathtub or fill the sitz bath over-the-toilet container with warm water. Make sure the water is not too hot by placing a drop on your wrist.  Sit in the warm water for 20-30 minutes.  After bathing, pat your perineum dry with a clean towel. Do not scrub the perineum as this could cause pain, irritation, or open any stitches you may have.  Keep the over-the-toilet sitz bath container clean by rinsing it thoroughly after each use. Ask for help in keeping the bathtub clean with diluted bleach and water (2 tablespoons of bleach to one half gallon of water).  Repeat the sitz bath as often as you would like to relieve perineal pain, itching, or discomfort.  Apply a numbing spray to the episiotomy site as directed by your health care provider. This may help with  discomfort.  Wash your hands before and after applying medicine to the episiotomy area.  Put about 3 witch hazel-containing hemorrhoid treatment pads on top of your sanitary pad. The witch hazel in the hemorrhoid pads helps with discomfort and swelling.  Get a peri-bottle to squeeze warm water on your perineum when urinating, spraying the area from front to back. Pat the area to dry.  Sitting on an inflatable ring or pillow may provide comfort.  Only take over-the-counter or prescription medicines for pain, discomfort, or fever as directed by your health care provider.  Do not have sexual intercourse or use tampons until your health care provider says it is okay. Typically, you must wait at least 6 weeks.  Keep all postpartum appointments. SEEK MEDICAL CARE IF:   Your pain is not relieved with medicines.  You have painful urination.  You have a fever. SEEK IMMEDIATE MEDICAL CARE IF:   You have redness, swelling, or increasing pain in the episiotomy area.  You have pus coming from the episiotomy area.  You notice a bad smell coming from the episiotomy area.  Your episiotomy opens.  You notice swelling in the episiotomy area that is larger than when you left the hospital.  You cannot urinate.   This information is not intended to replace advice given to you by your health care provider. Make sure you discuss any questions you have with your health care provider.   Document Released: 03/08/2005 Document Revised: 03/29/2014 Document Reviewed: 12/12/2012 Elsevier Interactive  Patient Education 2016 [employer].

## 2015-12-18 NOTE — Transfer of Care (Signed)
Immediate Anesthesia Transfer of Care Note  Patient: Rebekah Mclaughlin  Procedure(s) Performed: Procedure(s): Perineal revision of laceration separation (N/A)  Patient Location: PACU  Anesthesia Type:MAC  Level of Consciousness: awake  Airway & Oxygen Therapy: Patient Spontanous Breathing  Post-op Assessment: Report given to RN  Post vital signs: Reviewed and stable  Last Vitals:  Vitals:   12/18/15 1500 12/18/15 1515  BP: 95/65 94/66  Pulse: (!) 59 62  Resp: 11 12  Temp:      Last Pain:  Vitals:   12/18/15 1515  TempSrc:   PainSc: Asleep      Patients Stated Pain Goal: 5 (12/18/15 1500)  Complications: No apparent anesthesia complications

## 2015-12-18 NOTE — Anesthesia Postprocedure Evaluation (Addendum)
Anesthesia Post Note  Patient: Reinaldo RaddleCarrie Louissaint  Procedure(s) Performed: Procedure(s) (LRB): Perineal revision of laceration separation (N/A)  Patient location during evaluation: PACU Anesthesia Type: MAC Level of consciousness: awake and alert and oriented Pain management: pain level controlled Vital Signs Assessment: post-procedure vital signs reviewed and stable Respiratory status: spontaneous breathing, nonlabored ventilation and respiratory function stable Cardiovascular status: blood pressure returned to baseline and stable Postop Assessment: no signs of nausea or vomiting Anesthetic complications: no     Last Vitals:  Vitals:   12/18/15 1500 12/18/15 1515  BP: 95/65 94/66  Pulse: (!) 59 62  Resp: 11 12  Temp:      Last Pain:  Vitals:   12/18/15 1500  TempSrc:   PainSc: 2    Pain Goal: Patients Stated Pain Goal: 5 (12/18/15 1500)               Rilley Stash A.

## 2015-12-18 NOTE — Anesthesia Preprocedure Evaluation (Addendum)
Anesthesia Evaluation  Patient identified by MRN, date of birth, ID band Patient awake    Reviewed: Allergy & Precautions, NPO status , Patient's Chart, lab work & pertinent test results  Airway Mallampati: II  TM Distance: >3 FB Neck ROM: Full    Dental no notable dental hx. (+) Teeth Intact   Pulmonary neg pulmonary ROS,    Pulmonary exam normal breath sounds clear to auscultation       Cardiovascular negative cardio ROS Normal cardiovascular exam Rhythm:Regular Rate:Normal     Neuro/Psych negative psych ROS   GI/Hepatic negative GI ROS, Neg liver ROS,   Endo/Other  negative endocrine ROS  Renal/GU negative Renal ROS  negative genitourinary   Musculoskeletal negative musculoskeletal ROS (+)   Abdominal   Peds  Hematology negative hematology ROS (+)   Anesthesia Other Findings   Reproductive/Obstetrics 3rd degree laceration from vaginal delivery 11 days ago. Now with partial dehiscence. Currently breastfeeding.                            CBC    Component Value Date/Time   WBC 8.9 12/18/2015 1215   RBC 4.85 12/18/2015 1215   HGB 14.4 12/18/2015 1215   HCT 42.3 12/18/2015 1215   PLT 408 (H) 12/18/2015 1215   MCV 87.2 12/18/2015 1215   MCH 29.7 12/18/2015 1215   MCHC 34.0 12/18/2015 1215   RDW 13.9 12/18/2015 1215    Anesthesia Physical Anesthesia Plan  ASA: I  Anesthesia Plan: MAC   Post-op Pain Management:    Induction:   Airway Management Planned: Natural Airway and Nasal Cannula  Additional Equipment:   Intra-op Plan:   Post-operative Plan:   Informed Consent: I have reviewed the patients History and Physical, chart, labs and discussed the procedure including the risks, benefits and alternatives for the proposed anesthesia with the patient or authorized representative who has indicated his/her understanding and acceptance.   Dental advisory given  Plan  Discussed with: Anesthesiologist, CRNA and Surgeon  Anesthesia Plan Comments:         Anesthesia Quick Evaluation

## 2015-12-18 NOTE — Op Note (Signed)
12/18/2015  2:30 PM  PATIENT:  Reinaldo Raddlearrie Kashani  30 y.o. female  PRE-OPERATIVE DIAGNOSIS:  PERINEAL LACERATION SEQUELA  POST-OPERATIVE DIAGNOSIS:  PERINEAL LACERATION SEQUELA  PROCEDURE:  Procedure(s): Perineal revision of laceration separation (N/A) Repair of perineal dehiscence   SURGEON:  Surgeon(s) and Role:    * Noland FordyceKelly Zariah Jost, MD - Primary  PHYSICIAN ASSISTANT:   ASSISTANTS: none   ANESTHESIA:   local and MAC  EBL:  Total I/O In: 1400 [I.V.:1400] Out: 25 [Blood:25]  BLOOD ADMINISTERED:none  DRAINS: none   LOCAL MEDICATIONS USED:  LIDOCAINE  and OTHER Exparel  SPECIMEN:  No Specimen  DISPOSITION OF SPECIMEN:  N/A  COUNTS:  YES  TOURNIQUET:  * No tourniquets in log *  DICTATION: .Dragon Dictation  PLAN OF CARE: home after PACU  PATIENT DISPOSITION:  PACU - hemodynamically stable.   Delay start of Pharmacological VTE agent (>24hrs) due to surgical blood loss or risk of bleeding: yes  Abx: 2g Ancef Complications: none Findings: separated distal perineum repair, intact sphincter muscle, no evidence infection  Indication: 11 days PP from SVD, exam noted dehiscence of perineal repair  Procedure: After informed consent obtained, pt taken to OR. 2g Ancef administered. MAC anesthesia given. Vaginal and rectal exam done, surgical planning carried out. Vaginal laceration intact. Proximal perineal laceration intact. Anal sphincter intact. Distal perineal laceration, about 4 cm not approximated with granulation tissue exposed.  Betadine prep. 20 cc 1% lidocaine injected. Using a pick up and scalped and then Metzenbaum scissors, the granulation tissue was debrided and removed. A 2 mm skin edge was included in the debridement, though the external skin appeared healthy.  Bovie cauter was used to hemostasis. Deep layer of 3-0 vircyl rapide, 4 figure of 8 sutures placed. 4-o vicryl then used for subcuticular closure with interrupted sutures. Exparel injected.   Sponge, lap and  needle count correct.  Pt tolerated well.  Kellen Hover A. 12/18/2015 2:39 PM

## 2015-12-18 NOTE — Anesthesia Postprocedure Evaluation (Signed)
Anesthesia Post Note  Patient: Rebekah RaddleCarrie Mclaughlin  Procedure(s) Performed: Procedure(s) (LRB): Perineal revision of laceration separation (N/A)  Patient location during evaluation: PACU Anesthesia Type: MAC Level of consciousness: awake and alert and oriented Pain management: pain level controlled Vital Signs Assessment: post-procedure vital signs reviewed and stable Respiratory status: spontaneous breathing, nonlabored ventilation and respiratory function stable Cardiovascular status: stable and blood pressure returned to baseline Postop Assessment: no signs of nausea or vomiting Anesthetic complications: no     Last Vitals:  Vitals:   12/18/15 1500 12/18/15 1515  BP: 95/65 94/66  Pulse: (!) 59 62  Resp: 11 12  Temp:      Last Pain:  Vitals:   12/18/15 1500  TempSrc:   PainSc: 2    Pain Goal: Patients Stated Pain Goal: 5 (12/18/15 1500)               Jrake Rodriquez A.

## 2015-12-19 ENCOUNTER — Encounter (HOSPITAL_COMMUNITY): Payer: Self-pay | Admitting: Obstetrics

## 2016-01-01 ENCOUNTER — Ambulatory Visit (HOSPITAL_COMMUNITY)
Admission: RE | Admit: 2016-01-01 | Discharge: 2016-01-01 | Disposition: A | Discharge status: Home / Self Care | Payer: BLUE CROSS/BLUE SHIELD | Source: Ambulatory Visit | Attending: Obstetrics | Admitting: Obstetrics

## 2016-01-01 NOTE — Lactation Note (Signed)
Lactation Consult  Mother's reason for visit:  Per mom difficulty with latch  Visit Type: feeding assessment  Appointment Notes: Frenotomy done ( lip and tongue) Fissure at the base of the nipple developed  Pump and /bottle . Has only gained 5 oz in 2 weeks. Low milk supply ? Marland Kitchen Mom wants to work on positioning  And wants to bring pump. Confirmed for 11/12  Consult:  Initial Lactation Consultant:  Kathrin Greathouse  ________________________________________________________________________ Rebekah Mclaughlin Name:  Rebekah Mclaughlin Date of Birth:  12/07/2016 Pediatrician:  Dr. Jerrell Mylar  Gender:  female Gestational Age: [redacted]w[redacted]d (At Birth) Birth Weight:  7 lb 9.3 oz (3440 g) Weight at Discharge:  Weight: 7 lb 3 oz (3260 g)                  Date of Discharge:  12/08/2015     Filed Weights   12/07/15 0307 12/08/15 0000  Weight: 7 lb 9.3 oz (3440 g) 7 lb 3 oz (3260 g)  Last weight taken from [facility]:  8.8.5 oz 10/11 at     Location:Pediatrician's office Weight today: 8-11.0 oz , 3940 g     ________________________________________________________________________  Mother's Name: Rebekah Mclaughlin Type of delivery:  Vaginal Delivery  Breastfeeding Experience: per mom 1st baby , Maternal Medical Conditions:  Breast augmentation ( per mom prior to implants , had limited breast tissue, and just a few breast changes with pregnancy. When milk came in did not experience any engorgement. 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 When Rebekah Mclaughlin wasn't gaining weight properly  New combination feeding ( breast feeding and supplementing, post pumping with a DEBP Medela #24 Flange ( which is comfortable). With 1 2- oz volume. Per mom currently feeding every 2.5 -3 hours. I'm only breastfeeding during the day and  pumping at night. We are also supplementing with formula  2 oz if EBM isn't available after every feeding . Or if Rebekah Mclaughlin is just receiving a bottle for feeding ( taking 4- 5 oz from Dr. Manson Passey board based nipple) Duration of feeding:  Depends - no real consistency Per mom Monday attended the [facility] due to a right sore nipple under the nipple ( like a fissure ). Per mom received assistance from the [facility]  And it has got'en  Better. Still present , but healing.  Per mom milk came in at 5 days without engorgement.  Last fed at 3:30 pm 30 ml    Infant Intake and Output Assessment  Voids:  >6  in 24 hrs.  Color:  Clear yellow Stools:  >2-3  in 24 hrs.  Color:  Brown and Yellow  ________________________________________________________________________  Maternal Breast Assessment  Breast:  Soft Nipple:  Erect Pain level:  0 Pain interventions:  Expressed breast milk  _______________________________________________________________________ Feeding Assessment/Evaluation  Initial feeding assessment:  Infant's oral assessment:  Frenotomy ( labial ( healed ) and anterior - almost healed. LC noted upper lip flanges well with latch and when examined by [facility] with a gloved finger. Baby has mobility of tongue. ( per mom Frenotomy was done at 80  days old - Dr. Margaretha Sheffield -[facility]  Without problems of healing. And baby has tolerated exercises well, post.   Positioning:  Cross cradle Right breast  LATCH documentation:  Latch:  2 = Grasps breast easily, tongue down, lips flanged, rhythmical sucking.  Audible swallowing:  1 = A few with stimulation  Type of nipple:  2 = Everted at rest and after stimulation  Comfort (Breast/Nipple):  2 = Soft / non-tender  Hold (Positioning):  2 = No assistance needed to correctly position infant at breast  LATCH score:  9   Attached assessment:  Deep  Lips flanged:  Yes.    Lips untucked:  Yes.    Suck assessment:  Nutritive and Nonnutritive  LC noted nutritive at 1st and  a lot of hanging out non - nutritive feeding behavior.  LC added a Double SNS and had mom take him off briefly to attach . Baby tolerated  Well and fed for 20 plus minutes in a active participating pattern , pausing intermittently. Baby took the 2 oz in the SNS and also 2 oz from a bottle ( dad fed the baby) using the  Broader Dr. Manson PasseyBrown nipple. ( LC recommended it would help Rebekah Mclaughlin flange upper lip and lower With the smaller DR. Brown nipple.   Tools:  Supplemental nutrition system Instructed on use and cleaning of tool:  Yes.    Pre-feed weight: 3940 g , 8-11.0 oz  Post-feed weight:  4006 g , 8 - 13.3 oz  Amount transferred:  ( breast milk ) - 6 ml  Amount supplemented:  60 ml ( SNS )   Additional feeding was from a bottle = 2 oz   Total amount pumped post feed:  R- 5 ml ( breast baby had fed off )   L 25  Ml ( 30 ml total )   Total amount transferred: ( breast milk @  BREAST ) = 6 ML  Total supplement given:  120 ML ( FORMULA )   Lactation Impression:  434 week old Rebekah Mclaughlin's weight has increased since Tuesday's weight at the Dr. By 3 plus oz  Parents are consistently supplementing with a bottle after breast feeding. Mom aware her milk supply is low and discussed at consults ways to increase ( see plan below)  Mom is very motivated - appears at the consult to be very tired, although she mentioned she is  Getting 3 hours stretches of rest 2-3 times in 24 hours . Rebekah Mclaughlin still likes being up at night.   When Darden RestaurantsHenry latched , obtained depth well, but does a lot of non - nutritive feeding ,  Added the SNS , and improved.   Lactation Plan Of Care:  Focus on what you can give your baby not what you can't - Breast milk wise Goals - to increase milk supply - google low milk supply and follow hand out provided and discussed Start Lactation support with 5 herbs to increase supply  Plenty of fluids , esp. Water, nutritious snacks and meals  Rest and naps  Consider power pumping once a day over 1  hour as discussed  If at all possible use the SNS at the breast ( both ) for 20 mins each - can skip pumping at that feeding  Bottle nipple - smaller Dr. Manson PasseyBrown  - sue to supplement if not using the SNS.  Extra pumping - after 5-6 feedings a day 10 -20 mins - reminder center bottles and then don't watch them

## 2016-01-06 ENCOUNTER — Ambulatory Visit (HOSPITAL_COMMUNITY): Admission: RE | Admit: 2016-01-06 | Payer: BLUE CROSS/BLUE SHIELD | Source: Ambulatory Visit

## 2017-11-18 LAB — OB RESULTS CONSOLE HEPATITIS B SURFACE ANTIGEN: Hepatitis B Surface Ag: NEGATIVE

## 2017-11-18 LAB — OB RESULTS CONSOLE ABO/RH: RH Type: NEGATIVE

## 2017-11-18 LAB — OB RESULTS CONSOLE RUBELLA ANTIBODY, IGM: RUBELLA: IMMUNE

## 2017-11-18 LAB — OB RESULTS CONSOLE RPR: RPR: NONREACTIVE

## 2017-11-18 LAB — OB RESULTS CONSOLE HIV ANTIBODY (ROUTINE TESTING): HIV: NONREACTIVE

## 2018-05-15 LAB — OB RESULTS CONSOLE GC/CHLAMYDIA

## 2018-05-25 ENCOUNTER — Encounter (HOSPITAL_COMMUNITY): Payer: Self-pay | Admitting: *Deleted

## 2018-05-28 NOTE — H&P (Signed)
Rebekah Mclaughlin is a 33 y.o. female presenting for primary C/S secondary to h/o perineal laceration dehiscence which ultimately required revision and was left to heal by secondary intention. NIPS wnl.  Rh negative.  GBS negative.   OB History    Gravida  2   Para  1   Term  1   Preterm      AB      Living  1     SAB      TAB      Ectopic      Multiple  0   Live Births  1          Past Medical History:  Diagnosis Date  . Medical history non-contributory    Past Surgical History:  Procedure Laterality Date  . BREAST ENHANCEMENT SURGERY    . LASIK    . PERINEOPLASTY N/A 12/18/2015   Procedure: Perineal revision of laceration separation;  Surgeon: Noland Fordyce, MD;  Location: [facility];  Service: Gynecology;  Laterality: N/A;  . WISDOM TOOTH EXTRACTION     Family History: family history includes Breast cancer in her paternal grandmother; Cancer in her maternal grandmother; Lung cancer in her paternal grandfather; Multiple sclerosis in her father. Social History:  reports that she has never smoked. She has never used smokeless tobacco. She reports that she does not drink alcohol or use drugs.     Maternal Diabetes: No Genetic Screening: Normal Maternal Ultrasounds/Referrals: Normal Fetal Ultrasounds or other Referrals:  None Maternal Substance Abuse:  No Significant Maternal Medications:  None Significant Maternal Lab Results:  Lab values include: Group B Strep negative, Rh negative Other Comments:  None  ROS Maternal Medical History:  Prenatal complications: no prenatal complications Prenatal Complications - Diabetes: none.      Last menstrual period 09/08/2017, unknown if currently breastfeeding. Maternal Exam:  Abdomen: Fundal height is c/w dates.   Estimated fetal weight is 8#.       Physical Exam  Constitutional: She is oriented to person, place, and time. She appears well-developed and well-nourished.  GI: Soft. There is no rebound and no guarding.   Neurological: She is alert and oriented to person, place, and time.  Skin: Skin is warm and dry.  Psychiatric: She has a normal mood and affect. Her behavior is normal.    Prenatal labs: ABO, Rh: O/Negative/-- (08/30 0000) Antibody:   Rubella: Immune (08/30 0000) RPR: Nonreactive (08/30 0000)  HBsAg: Negative (08/30 0000)  HIV: Non-reactive (08/30 0000)  GBS:     Assessment/Plan: 33yo G2P1001 at 39 weeks for primary C/S -Patient counseled re: risk of bleeding, infection, scarring, and damage to surrounding structures.  Patient understands implications in future pregnancies such as abnormal placentation and risk of uterine rupture.  All questions were answered and the patient wishes to proceed.   Mitchel Honour 05/28/2018, 2:50 PM

## 2018-05-28 NOTE — H&P (Deleted)
  The note originally documented on this encounter has been moved the the encounter in which it belongs.  

## 2018-06-01 ENCOUNTER — Encounter (HOSPITAL_COMMUNITY): Payer: Self-pay

## 2018-06-08 NOTE — Patient Instructions (Signed)
Zahra Bissell  06/08/2018   Your procedure is scheduled on:  06/12/2018  Arrive at 0700 at Entrance C on [address] at [facility] and [employer]. You are invited to use the FREE valet parking or use the Visitor's parking deck.  Pick up the phone at the desk and dial [phone].  Call this number if you have problems the morning of surgery: [phone]  Remember:   Do not eat food:(After Midnight) Desps de medianoche.  Do not drink clear liquids: (After Midnight) Desps de medianoche.  Take these medicines the morning of surgery with A SIP OF WATER:  none   Do not wear jewelry, make-up or nail polish.  Do not wear lotions, powders, or perfumes. Do not wear deodorant.  Do not shave 48 hours prior to surgery.  Do not bring valuables to the hospital.  [facility] is not   responsible for any belongings or valuables brought to the hospital.  Contacts, dentures or bridgework may not be worn into surgery.  Leave suitcase in the car. After surgery it may be brought to your room.  For patients admitted to the hospital, checkout time is 11:00 AM the day of              discharge.      Please read over the following fact sheets that you were given:     Preparing for Surgery

## 2018-06-09 ENCOUNTER — Encounter (HOSPITAL_COMMUNITY)
Admission: AD | Disposition: A | Discharge status: Home / Self Care | Payer: Self-pay | Source: Home / Self Care | Attending: Obstetrics and Gynecology

## 2018-06-09 ENCOUNTER — Inpatient Hospital Stay (HOSPITAL_COMMUNITY)
Admission: AD | Admit: 2018-06-09 | Discharge: 2018-06-10 | DRG: 788 | Disposition: A | Discharge status: Home / Self Care | Payer: BLUE CROSS/BLUE SHIELD | Attending: Obstetrics and Gynecology | Admitting: Obstetrics and Gynecology

## 2018-06-09 ENCOUNTER — Encounter
Admission: RE | Admit: 2018-06-09 | Discharge: 2018-06-09 | Disposition: A | Discharge status: Home / Self Care | Payer: BLUE CROSS/BLUE SHIELD | Source: Ambulatory Visit

## 2018-06-09 ENCOUNTER — Other Ambulatory Visit: Payer: Self-pay

## 2018-06-09 ENCOUNTER — Inpatient Hospital Stay (HOSPITAL_COMMUNITY): Payer: BLUE CROSS/BLUE SHIELD | Admitting: Anesthesiology

## 2018-06-09 ENCOUNTER — Encounter (HOSPITAL_COMMUNITY): Payer: Self-pay

## 2018-06-09 DIAGNOSIS — Z3A39 39 weeks gestation of pregnancy: Secondary | ICD-10-CM | POA: Diagnosis not present

## 2018-06-09 DIAGNOSIS — Z98891 History of uterine scar from previous surgery: Secondary | ICD-10-CM

## 2018-06-09 DIAGNOSIS — Z6791 Unspecified blood type, Rh negative: Secondary | ICD-10-CM

## 2018-06-09 DIAGNOSIS — O26893 Other specified pregnancy related conditions, third trimester: Secondary | ICD-10-CM | POA: Diagnosis present

## 2018-06-09 LAB — CBC
HCT: 35.3 % — ABNORMAL LOW (ref 36.0–46.0)
HCT: 31.6 % — ABNORMAL LOW (ref 36.0–46.0)
HEMOGLOBIN: 11.3 g/dL — AB (ref 12.0–15.0)
Hemoglobin: 10.5 g/dL — ABNORMAL LOW (ref 12.0–15.0)
MCH: 28.7 pg (ref 26.0–34.0)
MCH: 29.5 pg (ref 26.0–34.0)
MCHC: 32 g/dL (ref 30.0–36.0)
MCHC: 33.2 g/dL (ref 30.0–36.0)
MCV: 89.6 fL (ref 80.0–100.0)
MCV: 88.8 fL (ref 80.0–100.0)
NRBC: 0 % (ref 0.0–0.2)
Platelets: 198 10*3/uL (ref 150–400)
Platelets: 180 10*3/uL (ref 150–400)
RBC: 3.94 MIL/uL (ref 3.87–5.11)
RBC: 3.56 MIL/uL — ABNORMAL LOW (ref 3.87–5.11)
RDW: 13.4 % (ref 11.5–15.5)
RDW: 13.2 % (ref 11.5–15.5)
WBC: 8.1 10*3/uL (ref 4.0–10.5)
WBC: 12.9 10*3/uL — ABNORMAL HIGH (ref 4.0–10.5)
nRBC: 0 % (ref 0.0–0.2)

## 2018-06-09 LAB — POCT FERN TEST: POCT Fern Test: POSITIVE

## 2018-06-09 LAB — RPR: RPR Ser Ql: NONREACTIVE

## 2018-06-09 SURGERY — Surgical Case
Anesthesia: Spinal | Wound class: Clean Contaminated

## 2018-06-09 MED ORDER — PHENYLEPHRINE HCL-NACL 20-0.9 MG/250ML-% IV SOLN
INTRAVENOUS | Status: AC
  Filled 2018-06-09: qty 250

## 2018-06-09 MED ORDER — IBUPROFEN 600 MG PO TABS
600.0000 mg | ORAL_TABLET | Freq: Four times a day (QID) | ORAL | Status: DC | PRN
  Administered 2018-06-09 – 2018-06-10 (×5): 600 mg via ORAL
  Filled 2018-06-09 (×4): qty 1

## 2018-06-09 MED ORDER — SIMETHICONE 80 MG PO CHEW: 80.0000 mg | CHEWABLE_TABLET | ORAL | Status: DC | PRN

## 2018-06-09 MED ORDER — OXYTOCIN 40 UNITS IN NORMAL SALINE INFUSION - SIMPLE MED
INTRAVENOUS | Status: AC
  Filled 2018-06-09: qty 1000

## 2018-06-09 MED ORDER — COCONUT OIL OIL: 1.0000 "application " | TOPICAL_OIL | Status: DC | PRN

## 2018-06-09 MED ORDER — MORPHINE SULFATE (PF) 0.5 MG/ML IJ SOLN
INTRAMUSCULAR | Status: AC
  Filled 2018-06-09: qty 10

## 2018-06-09 MED ORDER — DIPHENHYDRAMINE HCL 25 MG PO CAPS: 25.0000 mg | ORAL_CAPSULE | Freq: Four times a day (QID) | ORAL | Status: DC | PRN

## 2018-06-09 MED ORDER — LACTATED RINGERS IV SOLN
INTRAVENOUS | Status: DC
  Administered 2018-06-09 (×2): via INTRAVENOUS

## 2018-06-09 MED ORDER — SOD CITRATE-CITRIC ACID 500-334 MG/5ML PO SOLN
ORAL | Status: AC
  Administered 2018-06-09: 30 mL via ORAL
  Filled 2018-06-09: qty 15

## 2018-06-09 MED ORDER — DEXAMETHASONE SODIUM PHOSPHATE 10 MG/ML IJ SOLN
INTRAMUSCULAR | Status: DC | PRN
  Administered 2018-06-09: 4 mg via INTRAVENOUS

## 2018-06-09 MED ORDER — ONDANSETRON HCL 4 MG/2ML IJ SOLN: 4.0000 mg | Freq: Three times a day (TID) | INTRAMUSCULAR | Status: DC | PRN

## 2018-06-09 MED ORDER — SENNOSIDES-DOCUSATE SODIUM 8.6-50 MG PO TABS
2.0000 | ORAL_TABLET | ORAL | Status: DC
  Administered 2018-06-10: 2 via ORAL
  Filled 2018-06-09 (×2): qty 2

## 2018-06-09 MED ORDER — PRENATAL MULTIVITAMIN CH
1.0000 | ORAL_TABLET | Freq: Every day | ORAL | Status: DC
  Administered 2018-06-10: 1 via ORAL
  Filled 2018-06-09 (×2): qty 1

## 2018-06-09 MED ORDER — KETOROLAC TROMETHAMINE 30 MG/ML IJ SOLN
30.0000 mg | Freq: Four times a day (QID) | INTRAMUSCULAR | Status: AC | PRN
  Administered 2018-06-09: 30 mg via INTRAMUSCULAR

## 2018-06-09 MED ORDER — FENTANYL CITRATE (PF) 100 MCG/2ML IJ SOLN
INTRAMUSCULAR | Status: DC | PRN
  Administered 2018-06-09: 15 ug via INTRATHECAL

## 2018-06-09 MED ORDER — DEXTROSE IN LACTATED RINGERS 5 % IV SOLN
INTRAVENOUS | Status: DC
  Administered 2018-06-09: 125 mL/h via INTRAVENOUS

## 2018-06-09 MED ORDER — WITCH HAZEL-GLYCERIN EX PADS: 1.0000 "application " | MEDICATED_PAD | CUTANEOUS | Status: DC | PRN

## 2018-06-09 MED ORDER — NALOXONE HCL 0.4 MG/ML IJ SOLN: 0.4000 mg | INTRAMUSCULAR | Status: DC | PRN

## 2018-06-09 MED ORDER — ONDANSETRON HCL 4 MG/2ML IJ SOLN
INTRAMUSCULAR | Status: DC | PRN
  Administered 2018-06-09: 4 mg via INTRAVENOUS

## 2018-06-09 MED ORDER — CEFAZOLIN SODIUM-DEXTROSE 2-4 GM/100ML-% IV SOLN
2.0000 g | INTRAVENOUS | Status: AC
  Administered 2018-06-09: 2 g via INTRAVENOUS
  Filled 2018-06-09: qty 100

## 2018-06-09 MED ORDER — ONDANSETRON HCL 4 MG/2ML IJ SOLN
INTRAMUSCULAR | Status: AC
  Filled 2018-06-09: qty 2

## 2018-06-09 MED ORDER — SODIUM CHLORIDE 0.9 % IR SOLN
Status: DC | PRN
  Administered 2018-06-09: 1

## 2018-06-09 MED ORDER — KETOROLAC TROMETHAMINE 30 MG/ML IJ SOLN
INTRAMUSCULAR | Status: AC
  Filled 2018-06-09: qty 1

## 2018-06-09 MED ORDER — DEXAMETHASONE SODIUM PHOSPHATE 4 MG/ML IJ SOLN
INTRAMUSCULAR | Status: AC
  Filled 2018-06-09: qty 1

## 2018-06-09 MED ORDER — DIBUCAINE 1 % RE OINT: 1.0000 "application " | TOPICAL_OINTMENT | RECTAL | Status: DC | PRN

## 2018-06-09 MED ORDER — MEDROXYPROGESTERONE ACETATE 150 MG/ML IM SUSP: 150.0000 mg | INTRAMUSCULAR | Status: DC | PRN

## 2018-06-09 MED ORDER — MEPERIDINE HCL 25 MG/ML IJ SOLN: 6.2500 mg | INTRAMUSCULAR | Status: DC | PRN

## 2018-06-09 MED ORDER — TETANUS-DIPHTH-ACELL PERTUSSIS 5-2.5-18.5 LF-MCG/0.5 IM SUSP: 0.5000 mL | Freq: Once | INTRAMUSCULAR | Status: DC

## 2018-06-09 MED ORDER — BUPIVACAINE IN DEXTROSE 0.75-8.25 % IT SOLN
INTRATHECAL | Status: DC | PRN
  Administered 2018-06-09: 2 mL via INTRATHECAL

## 2018-06-09 MED ORDER — MORPHINE SULFATE (PF) 0.5 MG/ML IJ SOLN
INTRAMUSCULAR | Status: DC | PRN
  Administered 2018-06-09: .15 mg via INTRATHECAL

## 2018-06-09 MED ORDER — KETOROLAC TROMETHAMINE 30 MG/ML IJ SOLN: 30.0000 mg | Freq: Four times a day (QID) | INTRAMUSCULAR | Status: AC | PRN

## 2018-06-09 MED ORDER — FENTANYL CITRATE (PF) 100 MCG/2ML IJ SOLN
INTRAMUSCULAR | Status: AC
  Filled 2018-06-09: qty 2

## 2018-06-09 MED ORDER — OXYCODONE-ACETAMINOPHEN 5-325 MG PO TABS: 1.0000 | ORAL_TABLET | ORAL | Status: DC | PRN

## 2018-06-09 MED ORDER — DIPHENHYDRAMINE HCL 50 MG/ML IJ SOLN: 12.5000 mg | INTRAMUSCULAR | Status: DC | PRN

## 2018-06-09 MED ORDER — SIMETHICONE 80 MG PO CHEW
80.0000 mg | CHEWABLE_TABLET | Freq: Three times a day (TID) | ORAL | Status: DC
  Administered 2018-06-09 – 2018-06-10 (×5): 80 mg via ORAL
  Filled 2018-06-09 (×5): qty 1

## 2018-06-09 MED ORDER — NALBUPHINE HCL 10 MG/ML IJ SOLN: 5.0000 mg | INTRAMUSCULAR | Status: DC | PRN

## 2018-06-09 MED ORDER — PHENYLEPHRINE HCL-NACL 20-0.9 MG/250ML-% IV SOLN
INTRAVENOUS | Status: DC | PRN
  Administered 2018-06-09: 50 ug/min via INTRAVENOUS

## 2018-06-09 MED ORDER — SOD CITRATE-CITRIC ACID 500-334 MG/5ML PO SOLN
30.0000 mL | Freq: Once | ORAL | Status: AC
  Administered 2018-06-09: 30 mL via ORAL
  Filled 2018-06-09: qty 15

## 2018-06-09 MED ORDER — SODIUM CHLORIDE 0.9 % IV SOLN
INTRAVENOUS | Status: DC | PRN
  Administered 2018-06-09: 04:00:00 via INTRAVENOUS

## 2018-06-09 MED ORDER — MEASLES, MUMPS & RUBELLA VAC IJ SOLR: 0.5000 mL | Freq: Once | INTRAMUSCULAR | Status: DC

## 2018-06-09 MED ORDER — SCOPOLAMINE 1 MG/3DAYS TD PT72
1.0000 | MEDICATED_PATCH | Freq: Once | TRANSDERMAL | Status: DC
  Administered 2018-06-09: 1.5 mg via TRANSDERMAL
  Filled 2018-06-09 (×2): qty 1

## 2018-06-09 MED ORDER — SODIUM CHLORIDE 0.9% FLUSH: 3.0000 mL | INTRAVENOUS | Status: DC | PRN

## 2018-06-09 MED ORDER — DIPHENHYDRAMINE HCL 25 MG PO CAPS: 25.0000 mg | ORAL_CAPSULE | ORAL | Status: DC | PRN

## 2018-06-09 MED ORDER — FENTANYL CITRATE (PF) 100 MCG/2ML IJ SOLN: 25.0000 ug | INTRAMUSCULAR | Status: DC | PRN

## 2018-06-09 MED ORDER — MENTHOL 3 MG MT LOZG: 1.0000 | LOZENGE | OROMUCOSAL | Status: DC | PRN

## 2018-06-09 MED ORDER — OXYTOCIN 10 UNIT/ML IJ SOLN
INTRAVENOUS | Status: DC | PRN
  Administered 2018-06-09: 40 [IU] via INTRAVENOUS

## 2018-06-09 MED ORDER — SIMETHICONE 80 MG PO CHEW
80.0000 mg | CHEWABLE_TABLET | ORAL | Status: DC
  Administered 2018-06-10: 80 mg via ORAL
  Filled 2018-06-09: qty 1

## 2018-06-09 MED ORDER — OXYTOCIN 40 UNITS IN NORMAL SALINE INFUSION - SIMPLE MED: 2.5000 [IU]/h | INTRAVENOUS | Status: AC

## 2018-06-09 MED ORDER — NALOXONE HCL 4 MG/10ML IJ SOLN
1.0000 ug/kg/h | INTRAVENOUS | Status: DC | PRN
  Filled 2018-06-09: qty 5

## 2018-06-09 MED ORDER — NALBUPHINE HCL 10 MG/ML IJ SOLN: 5.0000 mg | Freq: Once | INTRAMUSCULAR | Status: DC | PRN

## 2018-06-09 SURGICAL SUPPLY — 35 items
BENZOIN TINCTURE PRP APPL 2/3 (GAUZE/BANDAGES/DRESSINGS) ×3 IMPLANT
CHLORAPREP W/TINT 26ML (MISCELLANEOUS) ×3 IMPLANT
CLAMP CORD UMBIL (MISCELLANEOUS) IMPLANT
CLOSURE WOUND 1/2 X4 (GAUZE/BANDAGES/DRESSINGS)
CLOTH BEACON ORANGE TIMEOUT ST (SAFETY) ×3 IMPLANT
DERMABOND ADHESIVE PROPEN (GAUZE/BANDAGES/DRESSINGS) ×2
DERMABOND ADVANCED (GAUZE/BANDAGES/DRESSINGS)
DERMABOND ADVANCED .7 DNX12 (GAUZE/BANDAGES/DRESSINGS) IMPLANT
DERMABOND ADVANCED .7 DNX6 (GAUZE/BANDAGES/DRESSINGS) ×1 IMPLANT
DRSG OPSITE POSTOP 4X10 (GAUZE/BANDAGES/DRESSINGS) ×3 IMPLANT
ELECT REM PT RETURN 9FT ADLT (ELECTROSURGICAL) ×3
ELECTRODE REM PT RTRN 9FT ADLT (ELECTROSURGICAL) ×1 IMPLANT
EXTRACTOR VACUUM KIWI (MISCELLANEOUS) IMPLANT
GLOVE BIO SURGEON STRL SZ 6 (GLOVE) ×3 IMPLANT
GLOVE BIOGEL PI IND STRL 6 (GLOVE) ×2 IMPLANT
GLOVE BIOGEL PI IND STRL 7.0 (GLOVE) ×1 IMPLANT
GLOVE BIOGEL PI INDICATOR 6 (GLOVE) ×4
GLOVE BIOGEL PI INDICATOR 7.0 (GLOVE) ×2
GOWN STRL REUS W/TWL LRG LVL3 (GOWN DISPOSABLE) ×6 IMPLANT
KIT ABG SYR 3ML LUER SLIP (SYRINGE) ×3 IMPLANT
NEEDLE HYPO 25X5/8 SAFETYGLIDE (NEEDLE) ×3 IMPLANT
NS IRRIG 1000ML POUR BTL (IV SOLUTION) ×3 IMPLANT
PACK C SECTION WH (CUSTOM PROCEDURE TRAY) ×3 IMPLANT
PAD OB MATERNITY 4.3X12.25 (PERSONAL CARE ITEMS) ×3 IMPLANT
PENCIL SMOKE EVAC W/HOLSTER (ELECTROSURGICAL) ×3 IMPLANT
STRIP CLOSURE SKIN 1/2X4 (GAUZE/BANDAGES/DRESSINGS) IMPLANT
SUT CHROMIC 0 CTX 36 (SUTURE) ×9 IMPLANT
SUT MON AB 2-0 CT1 27 (SUTURE) ×3 IMPLANT
SUT PDS AB 0 CT1 27 (SUTURE) IMPLANT
SUT PLAIN 0 NONE (SUTURE) IMPLANT
SUT VIC AB 0 CT1 36 (SUTURE) IMPLANT
SUT VIC AB 4-0 KS 27 (SUTURE) IMPLANT
TOWEL OR 17X24 6PK STRL BLUE (TOWEL DISPOSABLE) ×3 IMPLANT
TRAY FOLEY W/BAG SLVR 14FR LF (SET/KITS/TRAYS/PACK) IMPLANT
WATER STERILE IRR 1000ML POUR (IV SOLUTION) ×3 IMPLANT

## 2018-06-09 NOTE — Progress Notes (Addendum)
H&P reviewed with pt No changes Pt AROM this morning and started feeling contractions 6-7cm on exam Pt requests primary c-section R/b/a discussed, questions answered, informed consent

## 2018-06-09 NOTE — Transfer of Care (Signed)
Immediate Anesthesia Transfer of Care Note  Patient: Rebekah Mclaughlin  Procedure(s) Performed: CESAREAN SECTION (N/A )  Patient Location: PACU  Anesthesia Type:Spinal  Level of Consciousness: awake, alert , oriented and patient cooperative  Airway & Oxygen Therapy: Patient Spontanous Breathing  Post-op Assessment: Report given to RN and Post -op Vital signs reviewed and stable  Post vital signs: Reviewed and stable  Last Vitals:  Vitals Value Taken Time  BP    Temp    Pulse 82 06/09/2018  4:53 AM  Resp 15 06/09/2018  4:53 AM  SpO2 98 % 06/09/2018  4:53 AM  Vitals shown include unvalidated device data.  Last Pain:  Vitals:   06/09/18 0330  PainSc: 4          Complications: No apparent anesthesia complications

## 2018-06-09 NOTE — Anesthesia Postprocedure Evaluation (Signed)
Anesthesia Post Note  Patient: Rebekah Mclaughlin  Procedure(s) Performed: CESAREAN SECTION (N/A )     Patient location during evaluation: PACU Anesthesia Type: Spinal Level of consciousness: oriented and awake and alert Pain management: pain level controlled Vital Signs Assessment: post-procedure vital signs reviewed and stable Respiratory status: spontaneous breathing, respiratory function stable and nonlabored ventilation Cardiovascular status: blood pressure returned to baseline and stable Postop Assessment: no headache, no backache, no apparent nausea or vomiting and spinal receding Anesthetic complications: no    Last Vitals:  Vitals:   06/09/18 0236  BP: 114/77  Pulse: 84  Resp: 17  Temp: 36.9 C  SpO2: 98%    Last Pain:  Vitals:   06/09/18 0330  PainSc: 4    Pain Goal:                   Aadin Gaut A.

## 2018-06-09 NOTE — Anesthesia Procedure Notes (Signed)
Spinal  Patient location during procedure: OR Start time: 06/09/2018 3:51 AM End time: 06/09/2018 3:54 AM Staffing Anesthesiologist: Mal Amabile, MD Performed: anesthesiologist  Preanesthetic Checklist Completed: patient identified, site marked, surgical consent, pre-op evaluation, timeout performed, IV checked, risks and benefits discussed and monitors and equipment checked Spinal Block Patient position: sitting Prep: site prepped and draped and DuraPrep Patient monitoring: heart rate, cardiac monitor, continuous pulse ox and blood pressure Approach: midline Location: L3-4 Injection technique: single-shot Needle Needle type: Pencan  Needle gauge: 24 G Needle length: 9 cm Needle insertion depth: 5 cm Assessment Sensory level: T4 Additional Notes Patient tolerated procedure well. Adequate sensory level.

## 2018-06-09 NOTE — Lactation Note (Signed)
This note was copied from a baby's chart. Lactation Consultation Note  Patient Name: Rebekah Mclaughlin [id number] Date: 06/09/2018 Reason for consult: Initial assessment   P2,Baby 11 hours old.  Mother attempted to latch upon entering with baby cueing. Mother has history of breast augmentation approx 10 years ago.  Mother states she breastfed and pumped with latching and supply difficulty with her first child. She states she had breast augmentation due to very little breast tissue. At one time she was pumping 4 oz. Helped reposition baby for depth.  Reviewed hand expression and encouraged. Recommend mother post pump 4-6 times per day for 10-20 min with DEBP on initiation setting. Give baby back volume pumped at the next feeding. Reviewed cleaning and milk storage.  Referred mother to [email or web address]  Maternal Data Has patient been taught Hand Expression?: Yes  Feeding    LATCH Score Latch: Grasps breast easily, tongue down, lips flanged, rhythmical sucking.  Audible Swallowing: A few with stimulation  Type of Nipple: Everted at rest and after stimulation  Comfort (Breast/Nipple): Soft / non-tender  Hold (Positioning): Assistance needed to correctly position infant at breast and maintain latch.  LATCH Score: 8  Interventions Interventions: Support pillows  Lactation Tools Discussed/Used Pump Review: Setup, frequency, and cleaning;Milk Storage Initiated by:: Dahlia Byes RN IBCLC Date initiated:: 06/09/18   Consult Status      Rebekah Mclaughlin, Dulce Sellar 06/09/2018, 4:20 PM

## 2018-06-09 NOTE — Op Note (Signed)
Cesarean Section Procedure Note   Rebekah Mclaughlin  06/09/2018  Indications: Scheduled Proceedure/Maternal Request   Pre-operative Diagnosis: elective primary .   Post-operative Diagnosis: Same   Surgeon: Moishe Spice) and Role:    Zelphia Cairo, MD - Primary   Assistants: none  Anesthesia: spinal   Procedure Details:  The patient was seen in the Holding Room. The risks, benefits, complications, treatment options, and expected outcomes were discussed with the patient. The patient concurred with the proposed plan, giving informed consent. identified as Rebekah Mclaughlin and the procedure verified as C-Section Delivery. A Time Out was held and the above information confirmed.  After induction of anesthesia, the patient was draped and prepped in the usual sterile manner. A transverse was made and carried down through the subcutaneous tissue to the fascia. Fascial incision was made and extended transversely. The fascia was separated from the underlying rectus tissue superiorly and inferiorly. The peritoneum was identified and entered. Peritoneal incision was extended longitudinally. The utero-vesical peritoneal reflection was incised transversely and the bladder flap was bluntly freed from the lower uterine segment. A low transverse uterine incision was made. Delivered from cephalic presentation was a viable female infant. Cord ph was not sent the umbilical cord was clamped and cut cord blood was obtained for evaluation. The placenta was removed Intact and appeared normal. The uterine outline, tubes and ovaries appeared normal. The uterine incision was closed with running locked sutures of 0chromic gut.   Hemostasis was observed. Lavage was carried out until clear. The fascia was then reapproximated with running sutures of 0PDS.  The skin was closed with 4-0Vicryl.   Instrument, sponge, and needle counts were correct prior the abdominal closure and were correct at the conclusion of the case.    Estimated  Blood Loss: pending QA  Urine Output: clear  Specimens: none   Complications: no complications  Disposition: PACU - hemodynamically stable.   Maternal Condition: stable   Baby condition / location:  Couplet care / Skin to Skin  Attending Attestation: I was present and scrubbed for the entire procedure.   Signed: Surgeon(s): Zelphia Cairo, MD

## 2018-06-09 NOTE — Anesthesia Preprocedure Evaluation (Signed)
Anesthesia Evaluation  Patient identified by MRN, date of birth, ID band Patient awake    Reviewed: Allergy & Precautions, NPO status , Patient's Chart, lab work & pertinent test results  Airway Mallampati: II  TM Distance: >3 FB Neck ROM: Full    Dental no notable dental hx. (+) Teeth Intact   Pulmonary neg pulmonary ROS,    Pulmonary exam normal breath sounds clear to auscultation       Cardiovascular negative cardio ROS Normal cardiovascular exam Rhythm:Regular Rate:Normal     Neuro/Psych negative neurological ROS  negative psych ROS   GI/Hepatic negative GI ROS, Neg liver ROS,   Endo/Other  negative endocrine ROS  Renal/GU negative Renal ROS  negative genitourinary   Musculoskeletal   Abdominal   Peds  Hematology negative hematology ROS (+)   Anesthesia Other Findings   Reproductive/Obstetrics (+) Pregnancy Hx/o 3rd degree tear last pregnancy with perineal breakdown needing repair                             Anesthesia Physical Anesthesia Plan  ASA: II and emergent  Anesthesia Plan: Spinal   Post-op Pain Management:    Induction:   PONV Risk Score and Plan: 4 or greater and Scopolamine patch - Pre-op, Ondansetron and Treatment may vary due to age or medical condition  Airway Management Planned: Natural Airway  Additional Equipment:   Intra-op Plan:   Post-operative Plan:   Informed Consent: I have reviewed the patients History and Physical, chart, labs and discussed the procedure including the risks, benefits and alternatives for the proposed anesthesia with the patient or authorized representative who has indicated his/her understanding and acceptance.     Dental advisory given  Plan Discussed with: CRNA and Surgeon  Anesthesia Plan Comments:         Anesthesia Quick Evaluation

## 2018-06-09 NOTE — MAU Note (Addendum)
Patient reports SROM around 0200-clear fluid.  No VB.  + FM.  Scheduled for a csection on Monday.

## 2018-06-09 NOTE — Progress Notes (Signed)
Subjective: Postpartum Day 0: Cesarean Delivery Patient reports Comfortable  Objective: Vital signs in last 24 hours: Temp:  [97.6 F (36.4 C)-98.4 F (36.9 C)] 97.7 F (36.5 C) (03/20 0630) Pulse Rate:  [61-88] 61 (03/20 0630) Resp:  [14-22] 16 (03/20 0630) BP: (81-114)/(48-77) 101/70 (03/20 0630) SpO2:  [97 %-100 %] 99 % (03/20 0600) Weight:  [78.1 kg] 78.1 kg (03/20 0227)  Physical Exam:  General: alert, cooperative and no distress Lochia: appropriate Uterine Fundus: firm Incision: healing well DVT Evaluation: No evidence of DVT seen on physical exam.  Recent Labs    06/09/18 0304  HGB 11.3*  HCT 35.3*    Assessment/Plan: Status post Cesarean section. Doing well postoperatively.  Continue current care.  Roselle Locus II 06/09/2018, 7:51 AM

## 2018-06-10 LAB — CBC
HCT: 29.6 % — ABNORMAL LOW (ref 36.0–46.0)
Hemoglobin: 9.9 g/dL — ABNORMAL LOW (ref 12.0–15.0)
MCH: 29.6 pg (ref 26.0–34.0)
MCHC: 33.4 g/dL (ref 30.0–36.0)
MCV: 88.6 fL (ref 80.0–100.0)
PLATELETS: 162 10*3/uL (ref 150–400)
RBC: 3.34 MIL/uL — ABNORMAL LOW (ref 3.87–5.11)
RDW: 13.5 % (ref 11.5–15.5)
WBC: 9.2 10*3/uL (ref 4.0–10.5)
nRBC: 0 % (ref 0.0–0.2)

## 2018-06-10 MED ORDER — OXYCODONE HCL 5 MG PO TABS: 5.0000 mg | ORAL_TABLET | Freq: Four times a day (QID) | ORAL | 0 refills | Status: DC | PRN

## 2018-06-10 MED ORDER — IBUPROFEN 600 MG PO TABS: 600.0000 mg | ORAL_TABLET | Freq: Four times a day (QID) | ORAL | 1 refills | Status: AC | PRN

## 2018-06-10 MED ORDER — ACETAMINOPHEN 325 MG PO TABS
650.0000 mg | ORAL_TABLET | Freq: Four times a day (QID) | ORAL | Status: DC | PRN
  Administered 2018-06-10: 650 mg via ORAL
  Filled 2018-06-10: qty 2

## 2018-06-10 MED ORDER — ACETAMINOPHEN 325 MG PO TABS: 650.0000 mg | ORAL_TABLET | Freq: Four times a day (QID) | ORAL | 1 refills | Status: AC | PRN

## 2018-06-10 MED ORDER — RHO D IMMUNE GLOBULIN 1500 UNIT/2ML IJ SOSY
300.0000 ug | PREFILLED_SYRINGE | Freq: Once | INTRAMUSCULAR | Status: AC
  Administered 2018-06-10: 300 ug via INTRAVENOUS
  Filled 2018-06-10: qty 2

## 2018-06-10 NOTE — Lactation Note (Signed)
This note was copied from a baby's chart. Lactation Consultation Note  Patient Name: Boy Seandra Crislip [id number] Date: 06/10/2018 Reason for consult: Follow-up assessment;Term;Breast augmentation Baby is 32 hours old/6% weight loss.  Mom states she pumped this morning and gave expressed milk to baby with syringe.  She states she is having some difficulty with latch on left side.  Observed mom latch baby easily to right breast using cross cradle hold.  Baby obtained good depth and many swallows heard.  When baby finished feeding I assisted with football hold on the left.  Baby latched easily and mom was comfortable after initial latch on discomfort.  Mom is using good breast massage during feeding and audible swallows heard.  Discussed milk coming to volume and the prevention and treatment of engorgement.  She does have a breast pump at home.  Instructed to post pump every other feeding and give expressed milk back to baby.  Lactation outpatient services and support reviewed and encouraged prn.  Maternal Data    Feeding Feeding Type: Breast Fed  LATCH Score Latch: Grasps breast easily, tongue down, lips flanged, rhythmical sucking.  Audible Swallowing: Spontaneous and intermittent  Type of Nipple: Everted at rest and after stimulation  Comfort (Breast/Nipple): Soft / non-tender  Hold (Positioning): Assistance needed to correctly position infant at breast and maintain latch.  LATCH Score: 9  Interventions Interventions: Assisted with latch;Skin to skin;Adjust position;Breast massage;Support pillows;Position options;DEBP  Lactation Tools Discussed/Used     Consult Status Consult Status: Complete    Huston Foley 06/10/2018, 12:59 PM

## 2018-06-10 NOTE — Progress Notes (Signed)
Subjective: Postpartum Day 1: Cesarean Delivery Patient reports tolerating PO, + flatus and no problems voiding.   Delivered in early am yesterday. Wants to go home. Objective: Vital signs in last 24 hours: Temp:  [97.4 F (36.3 C)-99.3 F (37.4 C)] 97.4 F (36.3 C) (03/21 0515) Pulse Rate:  [60-71] 65 (03/21 0515) Resp:  [18-20] 18 (03/21 0515) BP: (95-110)/(60-76) 104/76 (03/21 0515) SpO2:  [98 %-100 %] 100 % (03/21 0515)  Physical Exam:  General: alert, cooperative and no distress Lochia: appropriate Uterine Fundus: firm Incision: healing well DVT Evaluation: No evidence of DVT seen on physical exam.  Recent Labs    06/09/18 0304 06/09/18 0735  HGB 11.3* 10.5*  HCT 35.3* 31.6*    Assessment/Plan: Status post Cesarean section. Doing well postoperatively.  Discharge home with standard precautions and return to clinic in 4-6 weeks.  Roselle Locus II 06/10/2018, 7:02 AM

## 2018-06-10 NOTE — Discharge Summary (Signed)
Obstetric Discharge Summary Reason for Admission: rupture of membranes Prenatal Procedures: none Intrapartum Procedures: cesarean: low cervical, transverse Postpartum Procedures: none Complications-Operative and Postpartum: none Hemoglobin  Date Value Ref Range Status  06/09/2018 10.5 (L) 12.0 - 15.0 g/dL Final   HCT  Date Value Ref Range Status  06/09/2018 31.6 (L) 36.0 - 46.0 % Final    Physical Exam:  General: alert, cooperative and no distress Lochia: appropriate Uterine Fundus: firm Incision: healing well DVT Evaluation: No evidence of DVT seen on physical exam.  Discharge Diagnoses: Term Pregnancy-delivered  Discharge Information: Date: 06/10/2018 Activity: pelvic rest Diet: routine Medications: PNV, Ibuprofen and Tylenol, oxycodone Condition: stable Instructions: refer to practice specific booklet Discharge to: home   Newborn Data: Live born female  Birth Weight: 6 lb 11.8 oz (3056 g) APGAR: 8, 9  Newborn Delivery   Birth date/time:  [birth year]/2018 04:09:00 Delivery type:  C-Section, Low Transverse Trial of labor:  No C-section categorization:  Primary     Home with mother.  Roselle Locus II 06/10/2018, 8:01 AM

## 2018-06-11 LAB — TYPE AND SCREEN
ABO/RH(D): O NEG
Antibody Screen: POSITIVE
Unit division: 0

## 2018-06-11 LAB — RH IG WORKUP (INCLUDES ABO/RH)
ABO/RH(D): O NEG
Fetal Screen: NEGATIVE
Gestational Age(Wks): 39.1
Unit division: 0

## 2018-06-11 LAB — BPAM RBC
Blood Product Expiration Date: 202003232359
ISSUE DATE / TIME: 202003100205
Unit Type and Rh: 9500

## 2018-06-12 ENCOUNTER — Inpatient Hospital Stay (HOSPITAL_COMMUNITY)
Admission: RE | Admit: 2018-06-12 | Payer: BLUE CROSS/BLUE SHIELD | Source: Home / Self Care | Admitting: Obstetrics & Gynecology

## 2018-09-07 ENCOUNTER — Encounter (HOSPITAL_COMMUNITY): Payer: Self-pay

## 2019-01-12 ENCOUNTER — Encounter (HOSPITAL_COMMUNITY): Payer: Self-pay

## 2019-01-12 ENCOUNTER — Observation Stay (HOSPITAL_COMMUNITY)
Admission: EM | Admit: 2019-01-12 | Discharge: 2019-01-13 | Disposition: A | Discharge status: Home / Self Care | Payer: BC Managed Care – PPO | Attending: Internal Medicine | Admitting: Internal Medicine

## 2019-01-12 ENCOUNTER — Other Ambulatory Visit: Payer: Self-pay

## 2019-01-12 ENCOUNTER — Emergency Department (HOSPITAL_COMMUNITY): Payer: BC Managed Care – PPO

## 2019-01-12 DIAGNOSIS — E860 Dehydration: Secondary | ICD-10-CM | POA: Diagnosis not present

## 2019-01-12 DIAGNOSIS — R000 Tachycardia, unspecified: Secondary | ICD-10-CM | POA: Diagnosis present

## 2019-01-12 DIAGNOSIS — R55 Syncope and collapse: Secondary | ICD-10-CM | POA: Diagnosis not present

## 2019-01-12 DIAGNOSIS — Z20828 Contact with and (suspected) exposure to other viral communicable diseases: Secondary | ICD-10-CM | POA: Diagnosis not present

## 2019-01-12 DIAGNOSIS — I358 Other nonrheumatic aortic valve disorders: Secondary | ICD-10-CM | POA: Insufficient documentation

## 2019-01-12 DIAGNOSIS — I517 Cardiomegaly: Secondary | ICD-10-CM | POA: Diagnosis not present

## 2019-01-12 LAB — CBC
HCT: 39 % (ref 36.0–46.0)
Hemoglobin: 12.2 g/dL (ref 12.0–15.0)
MCH: 28.4 pg (ref 26.0–34.0)
MCHC: 31.3 g/dL (ref 30.0–36.0)
MCV: 90.7 fL (ref 80.0–100.0)
Platelets: 294 10*3/uL (ref 150–400)
RBC: 4.3 MIL/uL (ref 3.87–5.11)
RDW: 14 % (ref 11.5–15.5)
WBC: 11.5 10*3/uL — ABNORMAL HIGH (ref 4.0–10.5)
nRBC: 0 % (ref 0.0–0.2)

## 2019-01-12 LAB — BASIC METABOLIC PANEL
Anion gap: 10 (ref 5–15)
BUN: 14 mg/dL (ref 6–20)
CO2: 22 mmol/L (ref 22–32)
Calcium: 9 mg/dL (ref 8.9–10.3)
Chloride: 106 mmol/L (ref 98–111)
Creatinine, Ser: 0.69 mg/dL (ref 0.44–1.00)
GFR calc Af Amer: >60 mL/min (ref >60)
GFR calc non Af Amer: >60 mL/min (ref >60)
Glucose, Bld: 122 mg/dL — ABNORMAL HIGH (ref 70–99)
Potassium: 3.6 mmol/L (ref 3.5–5.1)
Sodium: 138 mmol/L (ref 135–145)

## 2019-01-12 LAB — TSH: TSH: 1.698 u[IU]/mL (ref 0.350–4.500)

## 2019-01-12 LAB — CBG MONITORING, ED: Glucose-Capillary: 121 mg/dL — ABNORMAL HIGH (ref 70–99)

## 2019-01-12 IMAGING — CT CT ANGIO HEAD
3 of 10 series · 15 of 47 positions shown · IV contrast (omnipaque)
Comparison: No pertinent prior studies available for comparison.

CLINICAL DATA: Unequal pupils, altered mental status, evaluate for
sign of aneurysm; cerebral hemorrhage suspected.

EXAM:
CT ANGIOGRAPHY HEAD
TECHNIQUE: Multidetector CT imaging of the head was performed using the
standard protocol during bolus administration of intravenous
contrast. Multiplanar CT image reconstructions and MIPs were
obtained to evaluate the vascular anatomy.
CONTRAST:  100mL OMNIPAQUE IOHEXOL 350 MG/ML SOLN

[Series 11: ax thin · axial · 0.32mm/px · z∈[+1733,+1880]mm · 9 of 170 slices shown]
[im 11/170  brain]
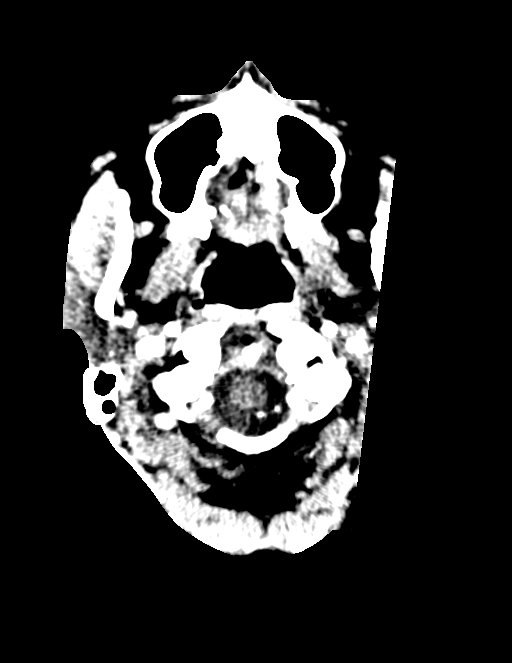
[im 32/170  bone]
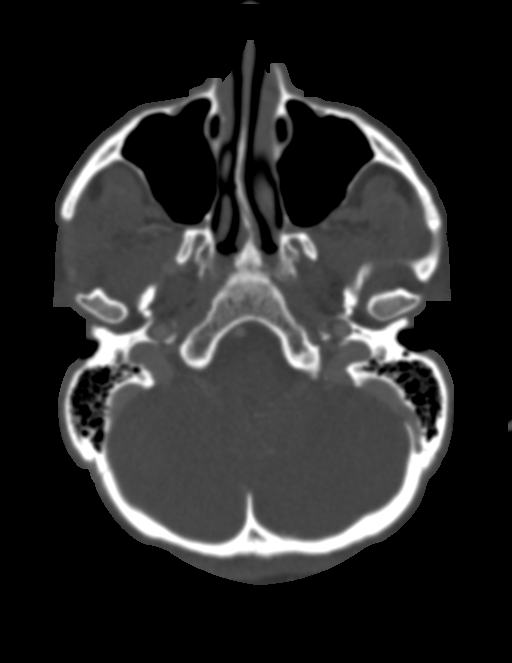
[im 53/170  brain]
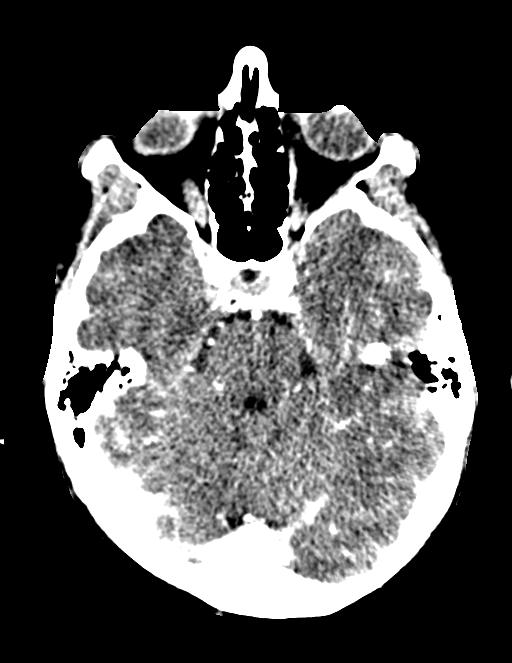
[im 64/170  bone]
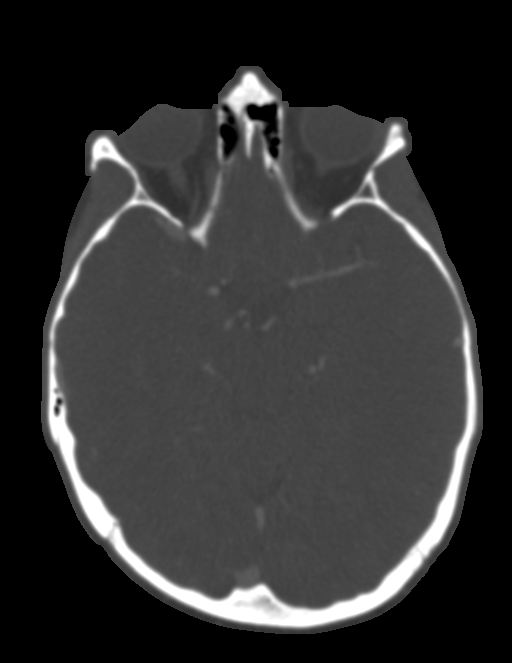
[im 85/170  brain]
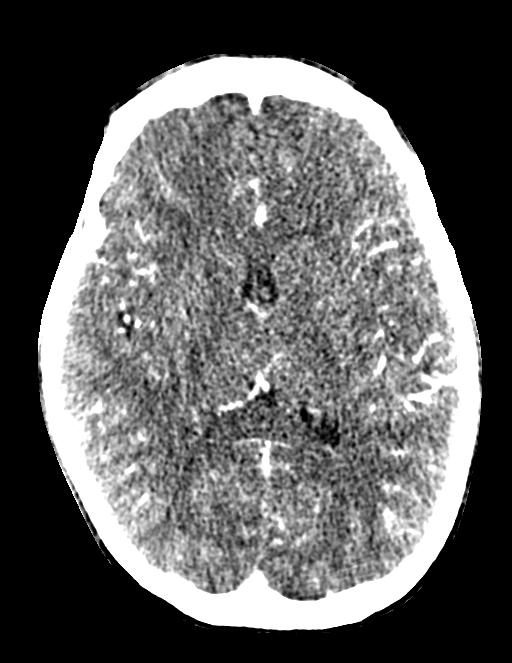
[im 106/170  bone]
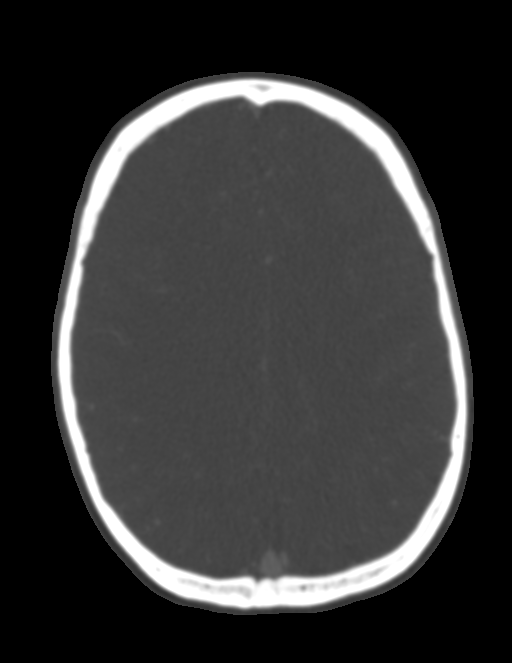
[im 117/170  brain]
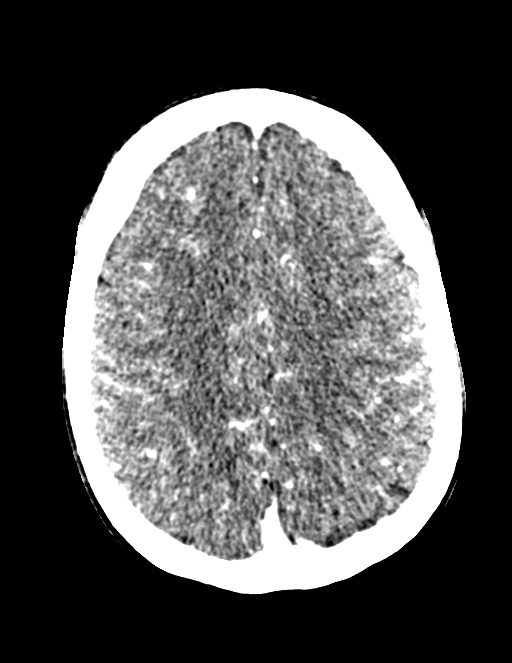
[im 138/170  bone]
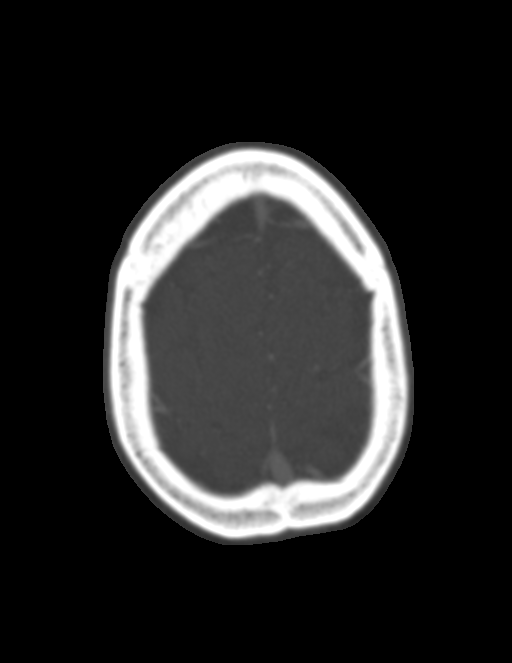
[im 159/170  brain]
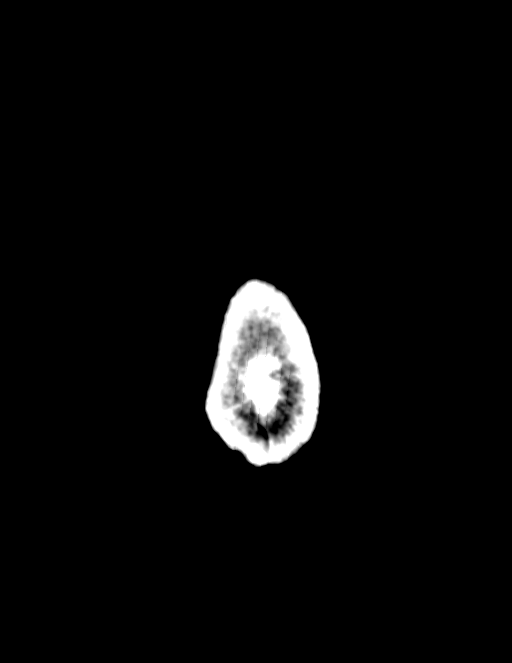

[Series 12: coronal thin · coronal · 0.34mm/px · 3 of 216 slices shown]
[im 44/216  brain]
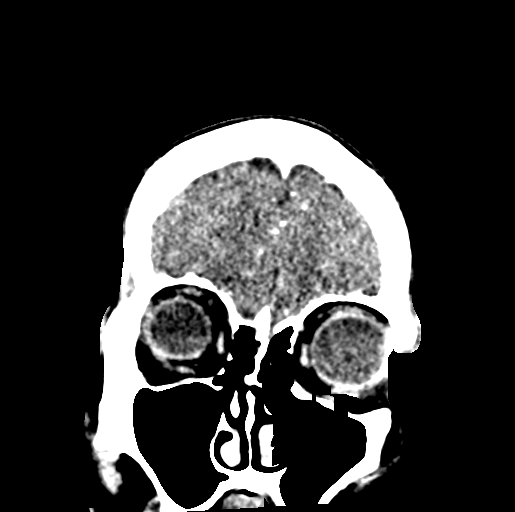
[im 87/216  brain]
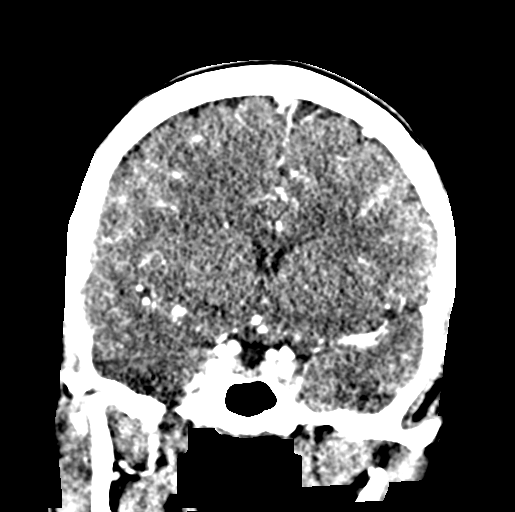
[im 130/216  brain]
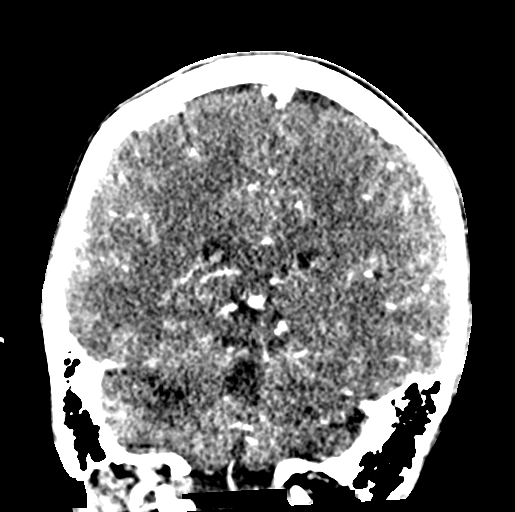

[Series 13: sagittal thin · sagittal · 0.34mm/px · 3 of 172 slices shown]
[im 72/172  brain]
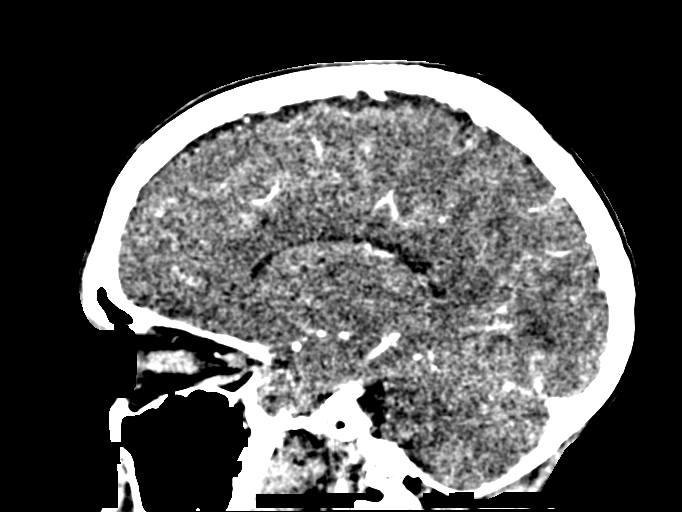
[im 105/172  brain]
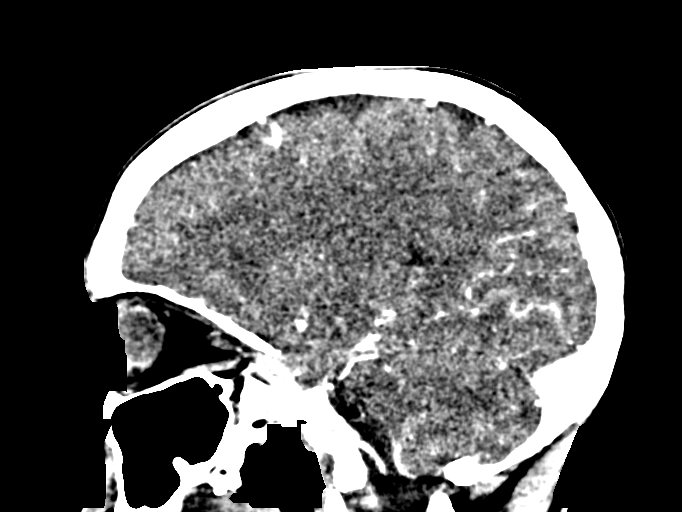
[im 138/172  brain]
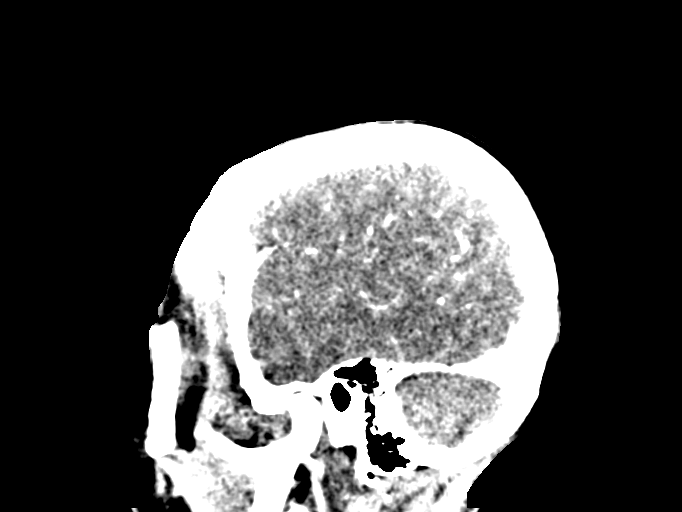

[15 of 47 positions shown; findings below may reference images not displayed]

FINDINGS: CT HEAD

Brain:

No evidence of acute intracranial hemorrhage.

No demarcated cortical infarction.

No evidence of intracranial mass.

No midline shift or extra-axial fluid collection.

Cerebral volume is normal for age.  Partially empty sella turcica.

Vascular: Reported separately

Skull: Normal. Negative for fracture or focal lesion.

Sinuses: No significant paranasal sinus disease or mastoid effusion

Orbits: Visualized orbits demonstrate no acute abnormality.

CTA HEAD

Anterior circulation:

The intracranial internal carotid arteries are patent without
significant stenosis.

The right middle and anterior cerebral arteries are patent without
significant proximal stenosis.

The left middle and anterior cerebral arteries are patent without
significant proximal stenosis.

No intracranial aneurysm is identified.

Posterior circulation:

The intracranial vertebral arteries are patent without significant
stenosis.

The basilar artery is patent without significant stenosis.

The bilateral posterior cerebral arteries are patent without
significant proximal stenosis.

Venous sinuses: Within limitations of contrast timing, no convincing
thrombus.

Anatomic variants: None significant. Posterior communicating
arteries are present bilaterally.
IMPRESSION: CT head:

No CT evidence of acute intracranial abnormality.

CTA head:

1. No intracranial large vessel occlusion or proximal high-grade
arterial stenosis.
2. No intracranial aneurysm identified.

## 2019-01-12 MED ORDER — SODIUM CHLORIDE (PF) 0.9 % IJ SOLN
INTRAMUSCULAR | Status: AC
  Administered 2019-01-12
  Filled 2019-01-12: qty 50

## 2019-01-12 MED ORDER — IOHEXOL 350 MG/ML SOLN
100.0000 mL | Freq: Once | INTRAVENOUS | Status: AC | PRN
  Administered 2019-01-12: 100 mL via INTRAVENOUS

## 2019-01-12 MED ORDER — SODIUM CHLORIDE 0.9 % IV BOLUS
1000.0000 mL | Freq: Once | INTRAVENOUS | Status: AC
  Administered 2019-01-12: 1000 mL via INTRAVENOUS

## 2019-01-12 MED ORDER — SODIUM CHLORIDE 0.9% FLUSH
3.0000 mL | Freq: Once | INTRAVENOUS | Status: AC
  Administered 2019-01-12: 3 mL via INTRAVENOUS

## 2019-01-12 NOTE — ED Triage Notes (Signed)
Pt coming from home after LOC during dinner tonight. Pt had two beers when all of sudden she passed out. Did not hit her head. Pt had an episode of emesis on the car ride over. Pt is pale upon arrival and feels weak. Husband states she is slow to respond. Not complaining of any pain. No visual deficits.

## 2019-01-12 NOTE — ED Provider Notes (Signed)
[facility]-EMERGENCY DEPT Provider Note   CSN: [id number] Arrival date & time: 01/12/19  2109     History   Chief Complaint Chief Complaint  Patient presents with   Loss of Consciousness    HPI Rebekah Mclaughlin is a 33 y.o. female with no significant past medical history presenting to the emergency department with near syncope.  The patient reports that she and her husband were at a friend's dinner party.  She had been feeling well throughout the course of the day in the past several days.  During this dinner party earlier this evening, the patient sat down to the table and began to feel "tired and like I was in sludge".  She states that she suddenly felt exhausted like her entire body felt worn down.  She put her head down on the table.  Her husband then came and attempted to help her get up from the table, quoting that she was "just dead weight."  The patient says she got very lightheaded but they are unclear if she actually syncopized.  She states this is never happened to her before.  She denies any history of dehydration.  Currently in the ER she reports feeling "extremely tired, like abdominal hard time keeping my eyes open".  She denies any chest pain, shortness of breath, headache, lightheadedness.  Her husband states that he tested positive for Covid in April 2020.  He states he had very mild symptoms.  He states he assumed that his wife and children also got the virus from him.  They never tested positive.  She may have had very mild symptoms at that time.  The patient denies any family history of brain aneurysm.  She denies any personal or family history of blood clots.  She does report a history of A. fib and MS in her father     HPI    Patient Active Problem List   Diagnosis Date Noted   Syncope 01/13/2019   Dehydration 01/13/2019   Tachycardia 01/13/2019   S/P cesarean section 06/09/2018   Postpartum care following vaginal delivery w 3rd (9/17)  12/07/2015   Active labor 12/06/2015    Past Surgical History:  Procedure Laterality Date   BREAST ENHANCEMENT SURGERY     CESAREAN SECTION N/A 06/09/2018   Procedure: CESAREAN SECTION;  Surgeon: Zelphia CairoAdkins, Gretchen, MD;  Location: [facility] LD ORS;  Service: Obstetrics;  Laterality: N/A;  Primary edc 06/15/18 NKDA  need RNFA   LASIK     PERINEOPLASTY N/A 12/18/2015   Procedure: Perineal revision of laceration separation;  Surgeon: Noland FordyceKelly Fogleman, MD;  Location: [facility] ORS;  Service: Gynecology;  Laterality: N/A;   WISDOM TOOTH EXTRACTION       OB History    Gravida  2   Para  1   Term  1   Preterm      AB      Living  1     SAB      TAB      Ectopic      Multiple  0   Live Births  1        Obstetric Comments  Perineal laceration         Home Medications    Prior to Admission medications   Medication Sig Start Date End Date Taking? Authorizing Provider  acetaminophen (TYLENOL) 325 MG tablet Take 2 tablets (650 mg total) by mouth every 6 (six) hours as needed for mild pain. 06/10/18  Yes Harold Hedgeomblin, James, MD  ibuprofen (ADVIL,MOTRIN) 600 MG tablet Take 1 tablet (600 mg total) by mouth every 6 (six) hours as needed for mild pain. 06/10/18  Yes Harold Hedge, MD  oxyCODONE (ROXICODONE) 5 MG immediate release tablet Take 1 tablet (5 mg total) by mouth every 6 (six) hours as needed for severe pain. 06/10/18   Harold Hedge, MD  Prenatal Vit-Fe Fumarate-FA (PRENATAL MULTIVITAMIN) TABS tablet Take 1 tablet by mouth daily at 12 noon.    [provider]    Family History Family History  Problem Relation Age of Onset   Multiple sclerosis Father    Breast cancer Maternal Grandmother    Cancer Paternal Grandmother    Lung cancer Paternal Grandfather     Social History Social History   Tobacco Use   Smoking status: Never Smoker   Smokeless tobacco: Never Used  Substance Use Topics   Alcohol use: No   Drug use: No     Allergies   Patient has no  known allergies.   Review of Systems Review of Systems  Constitutional: Positive for fatigue. Negative for chills and fever.  HENT: Negative for ear pain and sore throat.   Eyes: Negative for photophobia and visual disturbance.  Respiratory: Negative for cough and shortness of breath.   Cardiovascular: Negative for chest pain and palpitations.  Gastrointestinal: Negative for nausea and vomiting.  Genitourinary: Negative for difficulty urinating and dysuria.  Musculoskeletal: Negative for arthralgias and back pain.  Skin: Negative for pallor and rash.  Neurological: Positive for weakness. Negative for seizures, syncope, facial asymmetry, light-headedness, numbness and headaches.  All other systems reviewed and are negative.    Physical Exam Updated Vital Signs BP 100/64    Pulse 74    Temp 98.7 F (37.1 C) (Oral)    Resp 16    Wt 74.8 kg    SpO2 99%    BMI 23.68 kg/m   Physical Exam Vitals signs and nursing note reviewed.  Constitutional:      General: She is not in acute distress.    Appearance: She is well-developed.     Comments: Lying on bed with eyes closed, lying still  HENT:     Head: Normocephalic and atraumatic.  Eyes:     Extraocular Movements: Extraocular movements intact.     Conjunctiva/sclera: Conjunctivae normal.     Comments: Left pupil 4 mm reactive to light Right pupil 2 mm reactive to light  Neck:     Musculoskeletal: Neck supple.  Cardiovascular:     Rate and Rhythm: Normal rate and regular rhythm.     Pulses: Normal pulses.  Pulmonary:     Effort: Pulmonary effort is normal. No respiratory distress.     Breath sounds: Normal breath sounds.  Abdominal:     General: There is no distension.     Palpations: Abdomen is soft.     Tenderness: There is no abdominal tenderness. There is no right CVA tenderness, left CVA tenderness or guarding.  Skin:    General: Skin is warm and dry.  Neurological:     General: No focal deficit present.     Mental  Status: She is alert and oriented to person, place, and time.     Sensory: No sensory deficit.      ED Treatments / Results  Labs (all labs ordered are listed, but only abnormal results are displayed) Labs Reviewed  BASIC METABOLIC PANEL - Abnormal; Notable for the following components:      Result Value   Glucose,  Bld 122 (*)    All other components within normal limits  CBC - Abnormal; Notable for the following components:   WBC 11.5 (*)    All other components within normal limits  RAPID URINE DRUG SCREEN, HOSP PERFORMED - Abnormal; Notable for the following components:   Tetrahydrocannabinol POSITIVE (*)    All other components within normal limits  HEPATIC FUNCTION PANEL - Abnormal; Notable for the following components:   Total Protein 6.3 (*)    Albumin 3.4 (*)    Indirect Bilirubin 0.2 (*)    All other components within normal limits  CBC WITH DIFFERENTIAL/PLATELET - Abnormal; Notable for the following components:   RBC 3.72 (*)    Hemoglobin 10.6 (*)    HCT 33.8 (*)    All other components within normal limits  COMPREHENSIVE METABOLIC PANEL - Abnormal; Notable for the following components:   Calcium 7.9 (*)    Total Protein 5.9 (*)    Albumin 3.2 (*)    All other components within normal limits  CBG MONITORING, ED - Abnormal; Notable for the following components:   Glucose-Capillary 121 (*)    All other components within normal limits  SARS CORONAVIRUS 2 (TAT 6-24 HRS)  RESPIRATORY PANEL BY PCR  TSH  D-DIMER, QUANTITATIVE (NOT AT [facility])  LACTIC ACID, PLASMA  CK  MAGNESIUM  PHOSPHORUS  HCG, QUANTITATIVE, PREGNANCY  PROTIME-INR  APTT  MAGNESIUM  TSH  BRAIN NATRIURETIC PEPTIDE  ETHANOL  URINALYSIS, ROUTINE W REFLEX MICROSCOPIC  HIV ANTIBODY (ROUTINE TESTING W REFLEX)  I-STAT BETA HCG BLOOD, ED ([facility], [facility], [facility] ONLY)  TROPONIN I (HIGH SENSITIVITY)  TROPONIN I (HIGH SENSITIVITY)    EKG EKG Interpretation  Date/Time:  Friday January 12 2019 21:39:35  EDT Ventricular Rate:  65 PR Interval:    QRS Duration: 94 QT Interval:  436 QTC Calculation: 454 R Axis:   87 Text Interpretation:  Sinus rhythm Borderline prolonged PR interval Left atrial enlargement Nonspecific T abnrm, anterolateral leads No STEMI  Confirmed by Alvester Chou [phone]) on 01/12/2019 9:42:01 PM   Radiology Ct Angio Head W Or Wo Contrast  Result Date: 01/12/2019 CLINICAL DATA:  Unequal pupils, altered mental status, evaluate for sign of aneurysm; cerebral hemorrhage suspected. EXAM: CT ANGIOGRAPHY HEAD TECHNIQUE: Multidetector CT imaging of the head was performed using the standard protocol during bolus administration of intravenous contrast. Multiplanar CT image reconstructions and MIPs were obtained to evaluate the vascular anatomy. CONTRAST:  OMNIPAQUE IOHEXOL 350 MG/ML SOLN COMPARISON:  No pertinent prior studies available for comparison. FINDINGS: CT HEAD Brain: No evidence of acute intracranial hemorrhage. No demarcated cortical infarction. No evidence of intracranial mass. No midline shift or extra-axial fluid collection. Cerebral volume is normal for age.  Partially empty sella turcica. Vascular: Reported separately Skull: Normal. Negative for fracture or focal lesion. Sinuses: No significant paranasal sinus disease or mastoid effusion Orbits: Visualized orbits demonstrate no acute abnormality. CTA HEAD Anterior circulation: The intracranial internal carotid arteries are patent without significant stenosis. The right middle and anterior cerebral arteries are patent without significant proximal stenosis. The left middle and anterior cerebral arteries are patent without significant proximal stenosis. No intracranial aneurysm is identified. Posterior circulation: The intracranial vertebral arteries are patent without significant stenosis. The basilar artery is patent without significant stenosis. The bilateral posterior cerebral arteries are patent without significant  proximal stenosis. Venous sinuses: Within limitations of contrast timing, no convincing thrombus. Anatomic variants: None significant. Posterior communicating arteries are present bilaterally. IMPRESSION: CT head: No CT  evidence of acute intracranial abnormality. CTA head: 1. No intracranial large vessel occlusion or proximal high-grade arterial stenosis. 2. No intracranial aneurysm identified. Electronically Signed   By: Jackey Loge DO   On: 01/12/2019 23:18   Dg Chest 2 View  Result Date: 01/13/2019 CLINICAL DATA:  Initial evaluation for acute loss of consciousness. EXAM: CHEST - 2 VIEW COMPARISON:  None. FINDINGS: The cardiac and mediastinal silhouettes are within normal limits. The lungs are normally inflated. No airspace consolidation, pleural effusion, or pulmonary edema. No pneumothorax. No acute osseous abnormality. IMPRESSION: No active cardiopulmonary disease. Electronically Signed   By: Rise Mu M.D.   On: 01/13/2019 01:03    Procedures Procedures (including critical care time)  Medications Ordered in ED Medications  sodium chloride flush (NS) 0.9 % injection 3 mL (3 mLs Intravenous Not Given 01/13/19 1000)  enoxaparin (LOVENOX) injection 40 mg (has no administration in time range)  0.9 %  sodium chloride infusion ( Intravenous New Bag/Given 01/13/19 1000)  acetaminophen (TYLENOL) tablet 650 mg (has no administration in time range)    Or  acetaminophen (TYLENOL) suppository 650 mg (has no administration in time range)  senna-docusate (Senokot-S) tablet 1 tablet (has no administration in time range)  ondansetron (ZOFRAN) tablet 4 mg (has no administration in time range)    Or  ondansetron (ZOFRAN) injection 4 mg (has no administration in time range)  sodium chloride flush (NS) 0.9 % injection 3 mL (3 mLs Intravenous Given 01/12/19 2145)  sodium chloride (PF) 0.9 % injection (  Given 01/12/19 2332)  iohexol (OMNIPAQUE) 350 MG/ML injection 100 mL (100 mLs Intravenous  Contrast Given 01/12/19 2228)  sodium chloride 0.9 % bolus 1,000 mL (0 mLs Intravenous Stopped 01/13/19 0030)  sodium chloride 0.9 % bolus 1,000 mL (0 mLs Intravenous Stopped 01/13/19 0111)  sodium chloride 0.9 % bolus 1,000 mL (0 mLs Intravenous Stopped 01/13/19 0544)     Initial Impression / Assessment and Plan / ED Course  I have reviewed the triage vital signs and the nursing notes.  Pertinent labs & imaging results that were available during my care of the patient were reviewed by me and considered in my medical decision making (see chart for details).  33 year old female presenting to the emergency department with abrupt onset generalized weakness near syncope.  This occurred while she was sitting down at a party.  There is no reported dehydration prior to this or history consistent with orthostatic hypotension.  She states it feels like her entire body suddenly became dead weight.  Even here in the ED she continues to feel exhausted.  On my initial physical exam, she has unequal pupils, with a larger left pupil.  She states she has never noted that her pupils are uneven, and her husband also says he is unaware of uneven pupils prior to this.  She has no headache or neuro deficits.  However given her overall presentation, this raises concern for brain bleed or an aneurysm.  Therefore I ordered a stat CT scan of her brain with angio to rule out the possibility of aneurysm.  We will also check her hemoglobin level.  She does not have heavy vaginal bleeding or any obvious signs of acute blood loss or hemorrhage.  We also check her electrolytes and her fluid status.  We will check a Covid test.  Her husband was positive several months ago, and it is possible that she was positive at that time with very mild symptoms, although she never got tested.  We will check troponin and EKG.  Plan to look for signs of arrhythmia, which remains a concern with this presentation.  On my initial  assessment, she is not tachycardic or hypoxic and has no chest pain or shortness of breath.  Therefore this lowers my suspicion for pulmonary embolism.  She was postpartum from March 2020.  However believe at this point that she would be outside the window for acute thromboembolic events.  Finally, she reports a family history of MS in her father.  This presentation does not appear consistent with MS to me, given how abrupt the onset of her symptoms were.  She has no signs of optic neuritis on my exam.  I will defer this potential workup to the inpatient team if they believe this may be a concern, once her emergent workup is complete.  Clinical Course as of Jan 13 1048  Fri Jan 12, 2019  2347 CTA head:  1. No intracranial large vessel occlusion or proximal high-grade arterial stenosis. 2. No intracranial aneurysm identified.     [MT]  Sat Jan 13, 2019  0011 Patient has no acute complaints.  She feels "tired."  She is now tachycardic with HR 115.  She denies CP, SOB, abdominal pain, headaches.   [MT]  6203 HR now 90 bpm, improved   [MT]  0038 Her husband steven was updated about plan.  Signout given to hospitalist who has requested several more labs.  I've asked nursing to clarify her pregnancy status   [MT]    Clinical Course User Index [MT] Daliya Parchment, Carola Rhine, MD     Final Clinical Impressions(s) / ED Diagnoses   Final diagnoses:  Near syncope    ED Discharge Orders    None       Wyvonnia Dusky, MD 01/13/19 1053

## 2019-01-13 ENCOUNTER — Emergency Department (HOSPITAL_COMMUNITY): Payer: BC Managed Care – PPO

## 2019-01-13 ENCOUNTER — Encounter (HOSPITAL_COMMUNITY): Payer: Self-pay

## 2019-01-13 ENCOUNTER — Other Ambulatory Visit: Payer: Self-pay

## 2019-01-13 ENCOUNTER — Observation Stay (HOSPITAL_BASED_OUTPATIENT_CLINIC_OR_DEPARTMENT_OTHER): Payer: BC Managed Care – PPO

## 2019-01-13 DIAGNOSIS — R55 Syncope and collapse: Secondary | ICD-10-CM

## 2019-01-13 DIAGNOSIS — R000 Tachycardia, unspecified: Secondary | ICD-10-CM

## 2019-01-13 DIAGNOSIS — E860 Dehydration: Secondary | ICD-10-CM

## 2019-01-13 LAB — COMPREHENSIVE METABOLIC PANEL
ALT: 16 U/L (ref 0–44)
AST: 16 U/L (ref 15–41)
Albumin: 3.2 g/dL — ABNORMAL LOW (ref 3.5–5.0)
Alkaline Phosphatase: 46 U/L (ref 38–126)
Anion gap: 6 (ref 5–15)
BUN: 12 mg/dL (ref 6–20)
CO2: 22 mmol/L (ref 22–32)
Calcium: 7.9 mg/dL — ABNORMAL LOW (ref 8.9–10.3)
Chloride: 111 mmol/L (ref 98–111)
Creatinine, Ser: 0.51 mg/dL (ref 0.44–1.00)
GFR calc Af Amer: >60 mL/min (ref >60)
GFR calc non Af Amer: >60 mL/min (ref >60)
Glucose, Bld: 91 mg/dL (ref 70–99)
Potassium: 3.6 mmol/L (ref 3.5–5.1)
Sodium: 139 mmol/L (ref 135–145)
Total Bilirubin: 0.7 mg/dL (ref 0.3–1.2)
Total Protein: 5.9 g/dL — ABNORMAL LOW (ref 6.5–8.1)

## 2019-01-13 LAB — RAPID URINE DRUG SCREEN, HOSP PERFORMED
Amphetamines: NOT DETECTED
Barbiturates: NOT DETECTED
Benzodiazepines: NOT DETECTED
Cocaine: NOT DETECTED
Opiates: NOT DETECTED
Tetrahydrocannabinol: POSITIVE — AB

## 2019-01-13 LAB — CBC WITH DIFFERENTIAL/PLATELET
Abs Immature Granulocytes: 0.02 10*3/uL (ref 0.00–0.07)
Basophils Absolute: 0 10*3/uL (ref 0.0–0.1)
Basophils Relative: 0 %
Eosinophils Absolute: 0 10*3/uL (ref 0.0–0.5)
Eosinophils Relative: 1 %
HCT: 33.8 % — ABNORMAL LOW (ref 36.0–46.0)
Hemoglobin: 10.6 g/dL — ABNORMAL LOW (ref 12.0–15.0)
Immature Granulocytes: 0 %
Lymphocytes Relative: 29 %
Lymphs Abs: 1.9 10*3/uL (ref 0.7–4.0)
MCH: 28.5 pg (ref 26.0–34.0)
MCHC: 31.4 g/dL (ref 30.0–36.0)
MCV: 90.9 fL (ref 80.0–100.0)
Monocytes Absolute: 0.5 10*3/uL (ref 0.1–1.0)
Monocytes Relative: 8 %
Neutro Abs: 4 10*3/uL (ref 1.7–7.7)
Neutrophils Relative %: 62 %
Platelets: 205 10*3/uL (ref 150–400)
RBC: 3.72 MIL/uL — ABNORMAL LOW (ref 3.87–5.11)
RDW: 14.3 % (ref 11.5–15.5)
WBC: 6.4 10*3/uL (ref 4.0–10.5)
nRBC: 0 % (ref 0.0–0.2)

## 2019-01-13 LAB — RESPIRATORY PANEL BY PCR

## 2019-01-13 LAB — PHOSPHORUS: Phosphorus: 3.4 mg/dL (ref 2.5–4.6)

## 2019-01-13 LAB — HIV ANTIBODY (ROUTINE TESTING W REFLEX): HIV Screen 4th Generation wRfx: NONREACTIVE

## 2019-01-13 LAB — MAGNESIUM
Magnesium: 1.8 mg/dL (ref 1.7–2.4)
Magnesium: 1.9 mg/dL (ref 1.7–2.4)

## 2019-01-13 LAB — HEPATIC FUNCTION PANEL
ALT: 16 U/L (ref 0–44)
AST: 21 U/L (ref 15–41)
Albumin: 3.4 g/dL — ABNORMAL LOW (ref 3.5–5.0)
Alkaline Phosphatase: 47 U/L (ref 38–126)
Bilirubin, Direct: 0.2 mg/dL (ref 0.0–0.2)
Indirect Bilirubin: 0.2 mg/dL — ABNORMAL LOW (ref 0.3–0.9)
Total Bilirubin: 0.4 mg/dL (ref 0.3–1.2)
Total Protein: 6.3 g/dL — ABNORMAL LOW (ref 6.5–8.1)

## 2019-01-13 LAB — TROPONIN I (HIGH SENSITIVITY)
Troponin I (High Sensitivity): <2 ng/L (ref <18)
Troponin I (High Sensitivity): <2 ng/L (ref <18)

## 2019-01-13 LAB — HCG, QUANTITATIVE, PREGNANCY: hCG, Beta Chain, Quant, S: <1 m[IU]/mL (ref <5)

## 2019-01-13 LAB — BRAIN NATRIURETIC PEPTIDE: B Natriuretic Peptide: 88.1 pg/mL (ref 0.0–100.0)

## 2019-01-13 LAB — LACTIC ACID, PLASMA: Lactic Acid, Venous: 0.8 mmol/L (ref 0.5–1.9)

## 2019-01-13 LAB — ECHOCARDIOGRAM COMPLETE: Weight: 2640 oz

## 2019-01-13 LAB — PROTIME-INR
INR: 1 (ref 0.8–1.2)
Prothrombin Time: 13 seconds (ref 11.4–15.2)

## 2019-01-13 LAB — CK: Total CK: 60 U/L (ref 38–234)

## 2019-01-13 LAB — D-DIMER, QUANTITATIVE: D-Dimer, Quant: <0.27 ug/mL-FEU (ref 0.00–0.50)

## 2019-01-13 LAB — ETHANOL: Alcohol, Ethyl (B): <10 mg/dL (ref <10)

## 2019-01-13 LAB — APTT: aPTT: 27 seconds (ref 24–36)

## 2019-01-13 LAB — SARS CORONAVIRUS 2 (TAT 6-24 HRS): SARS Coronavirus 2: NEGATIVE

## 2019-01-13 LAB — TSH: TSH: 0.536 u[IU]/mL (ref 0.350–4.500)

## 2019-01-13 IMAGING — CR DG CHEST 2V
2 series · 2 of 2 positions shown · non-contrast
Comparison: None.

CLINICAL DATA: Initial evaluation for acute loss of consciousness.

EXAM:
CHEST - 2 VIEW

[w chest pa]
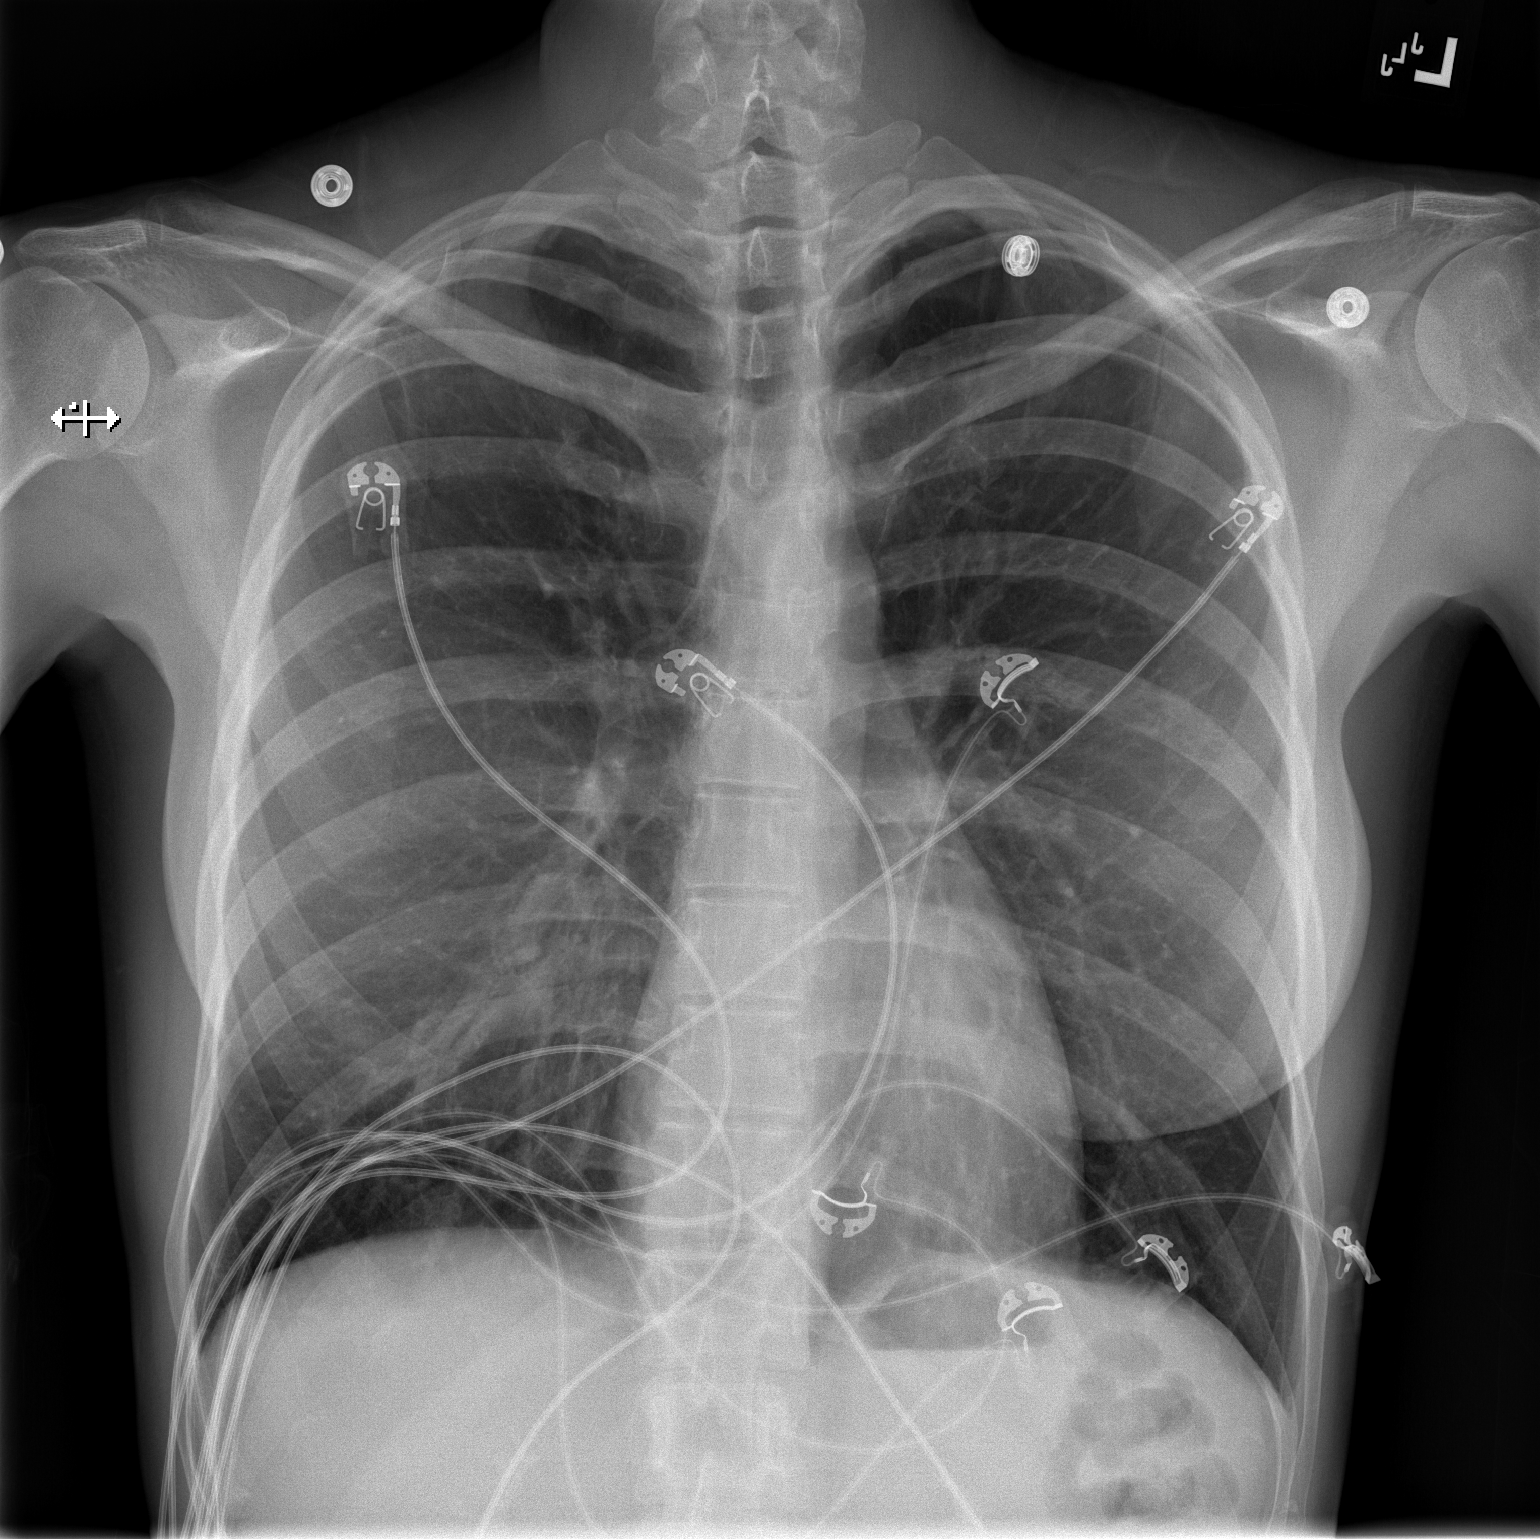

[w chest lat]
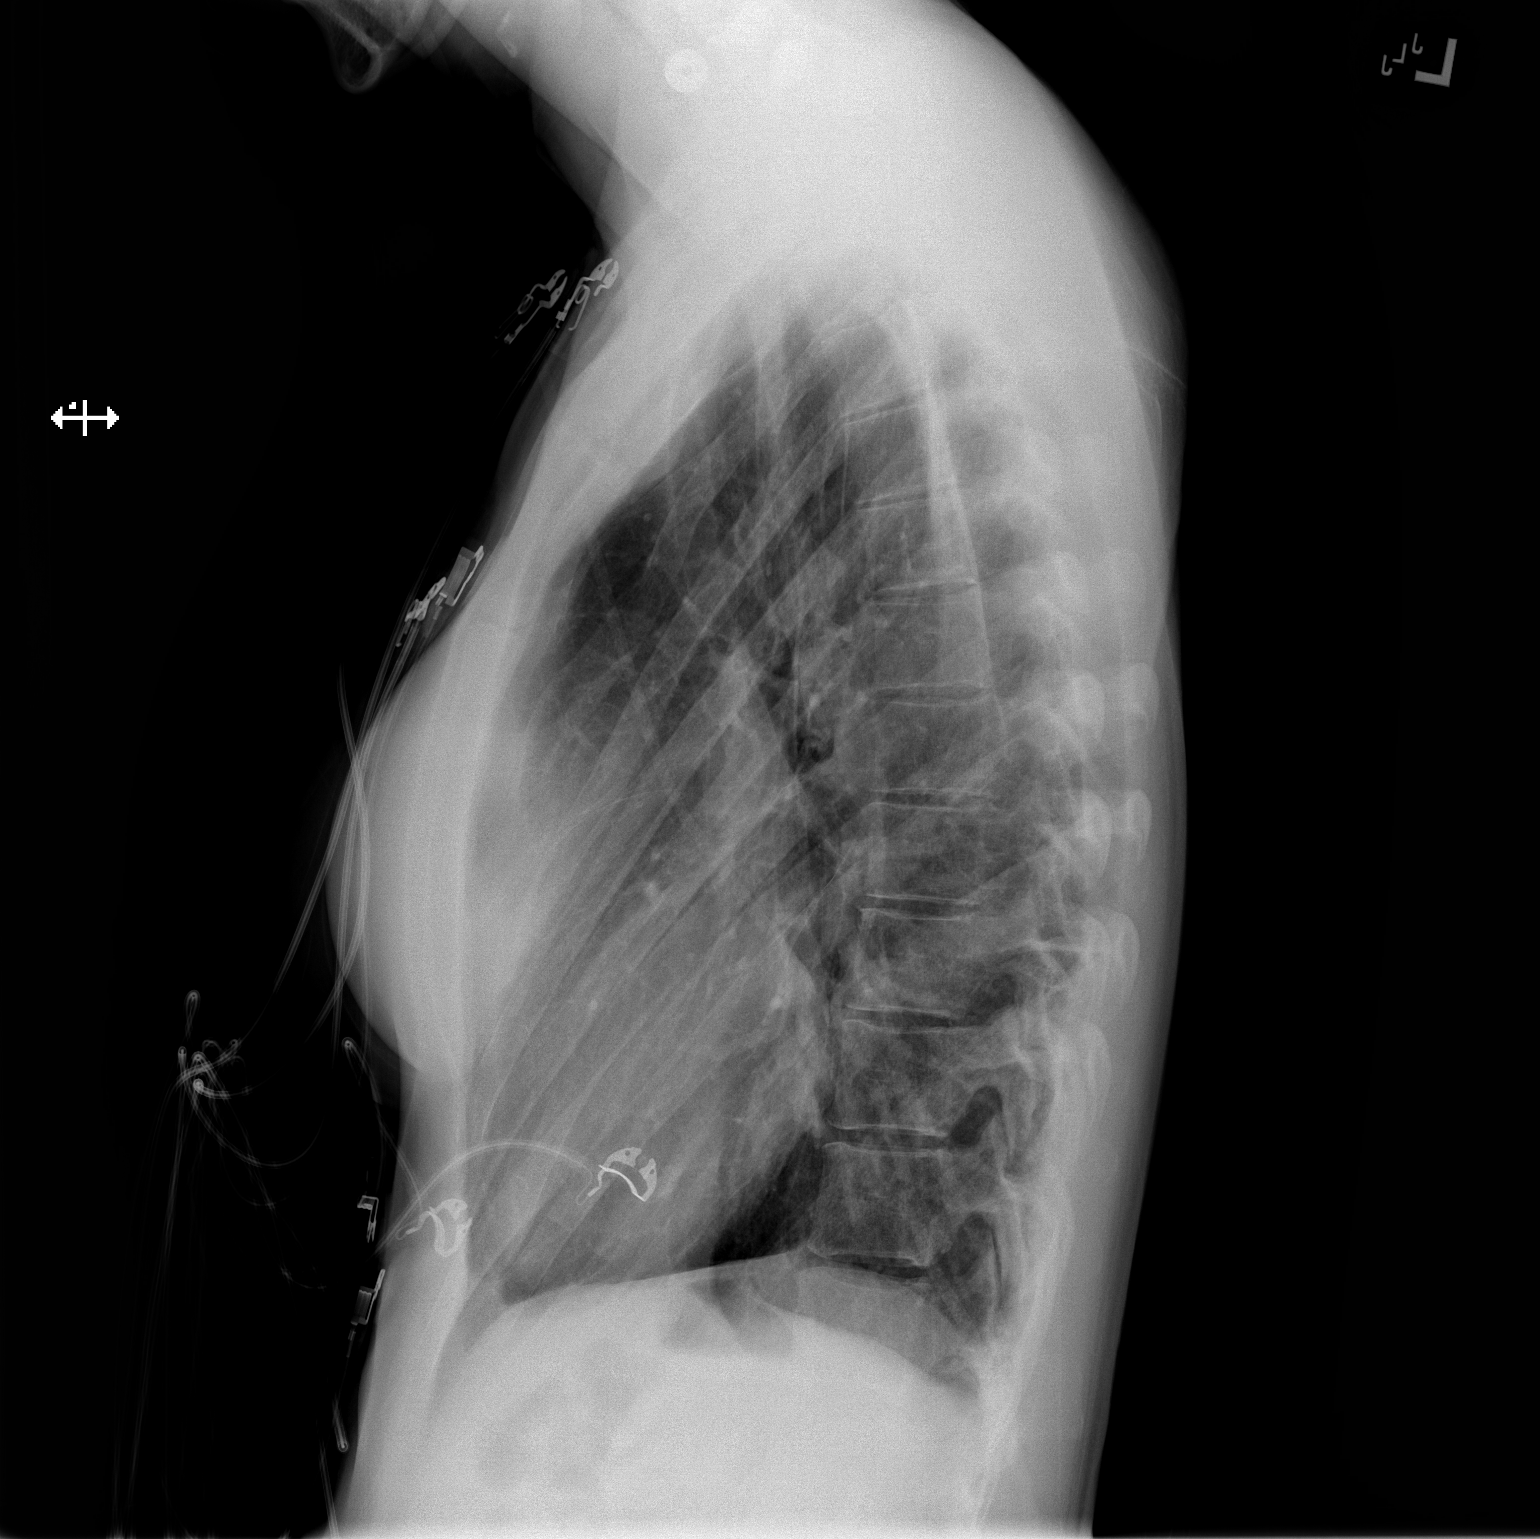

[2 of 2 positions shown; findings below may reference images not displayed]

FINDINGS: The cardiac and mediastinal silhouettes are within normal limits.

The lungs are normally inflated. No airspace consolidation, pleural
effusion, or pulmonary edema. No pneumothorax.

No acute osseous abnormality.
IMPRESSION: No active cardiopulmonary disease.

## 2019-01-13 MED ORDER — ONDANSETRON HCL 4 MG/2ML IJ SOLN: 4.0000 mg | Freq: Four times a day (QID) | INTRAMUSCULAR | Status: DC | PRN

## 2019-01-13 MED ORDER — ENOXAPARIN SODIUM 40 MG/0.4ML ~~LOC~~ SOLN
40.0000 mg | SUBCUTANEOUS | Status: DC
  Filled 2019-01-13: qty 0.4

## 2019-01-13 MED ORDER — ACETAMINOPHEN 325 MG PO TABS: 650.0000 mg | ORAL_TABLET | Freq: Four times a day (QID) | ORAL | Status: DC | PRN

## 2019-01-13 MED ORDER — ONDANSETRON HCL 4 MG PO TABS: 4.0000 mg | ORAL_TABLET | Freq: Four times a day (QID) | ORAL | Status: DC | PRN

## 2019-01-13 MED ORDER — SODIUM CHLORIDE 0.9 % IV SOLN
INTRAVENOUS | Status: DC
  Administered 2019-01-13: 10:00:00 via INTRAVENOUS

## 2019-01-13 MED ORDER — ACETAMINOPHEN 650 MG RE SUPP: 650.0000 mg | Freq: Four times a day (QID) | RECTAL | Status: DC | PRN

## 2019-01-13 MED ORDER — SODIUM CHLORIDE 0.9 % IV BOLUS
1000.0000 mL | Freq: Once | INTRAVENOUS | Status: AC
  Administered 2019-01-13: 1000 mL via INTRAVENOUS

## 2019-01-13 MED ORDER — SODIUM CHLORIDE 0.9% FLUSH: 3.0000 mL | Freq: Two times a day (BID) | INTRAVENOUS | Status: DC

## 2019-01-13 MED ORDER — SENNOSIDES-DOCUSATE SODIUM 8.6-50 MG PO TABS: 1.0000 | ORAL_TABLET | Freq: Every evening | ORAL | Status: DC | PRN

## 2019-01-13 NOTE — Progress Notes (Signed)
Echocardiogram 2D Echocardiogram has been performed.  Rebekah Mclaughlin Rebekah Mclaughlin 01/13/2019, 9:04 AM

## 2019-01-13 NOTE — Progress Notes (Signed)
Went over discharge papers with patient.  All questions answered.  VSS.  PT wheeled out via NT.

## 2019-01-13 NOTE — ED Notes (Signed)
ED TO INPATIENT HANDOFF REPORT  Name/Age/Gender Rebekah Mclaughlin 33 y.o. female  Code Status    Code Status Orders  (From admission, onward)         Start     Ordered   01/13/19 0821  Full code  Continuous     01/13/19 0822        Code Status History    Date Active Date Inactive Code Status Order ID Comments User Context   06/09/2018 0639 06/11/2018 0037 Full Code [id number]  Zelphia Cairo, MD Inpatient   12/07/2015 0658 12/08/2015 1655 Full Code [id number]  Maxie Better, MD Inpatient   12/06/2015 1915 12/07/2015 0658 Full Code [id number]  Maxie Better, MD Inpatient   12/06/2015 1915 12/06/2015 1915 Full Code [id number]  Amador Cunas, RN Inpatient   Advance Care Planning Activity      Home/SNF/Other Home  Chief Complaint syncope; emesis  Level of Care/Admitting Diagnosis ED Disposition    ED Disposition Condition Comment   Admit  Hospital Area: [facility] [[id number]]  Level of Care: Telemetry [5]  Admit to tele based on following criteria: Eval of Syncope  Covid Evaluation: Confirmed COVID Negative  Diagnosis: Syncope [206001]  Admitting Physician: Therisa Doyne [[id number]]  Attending Physician: Therisa Doyne [[id number]]  PT Class (Do Not Modify): Observation [104]  PT Acc Code (Do Not Modify): Observation [10022]       Medical History Past Medical History:  Diagnosis Date  . Medical history non-contributory     Allergies No Known Allergies  IV Location/Drains/Wounds Patient Lines/Drains/Airways Status   Active Line/Drains/Airways    Name:   Placement date:   Placement time:   Site:   Days:   Peripheral IV 01/12/19 Left Antecubital   01/12/19    2136    Antecubital   1          Labs/Imaging Results for orders placed or performed during the hospital encounter of 01/12/19 (from the past 48 hour(s))  D-dimer, quantitative (not at [facility])     Status: None   Collection Time: 01/12/19  9:09 PM  Result Value Ref Range    D-Dimer, Quant <0.27 0.00 - 0.50 ug/mL-FEU    Comment: (NOTE) At the manufacturer cut-off of 0.50 ug/mL FEU, this assay has been documented to exclude PE with a sensitivity and negative predictive value of 97 to 99%.  At this time, this assay has not been approved by the FDA to exclude DVT/VTE. Results should be correlated with clinical presentation. Performed at [facility], [address], [place], [place] [address]   Protime-INR     Status: None   Collection Time: 01/12/19  9:09 PM  Result Value Ref Range   Prothrombin Time 13.0 11.4 - 15.2 seconds   INR 1.0 0.8 - 1.2    Comment: (NOTE) INR goal varies based on device and disease states. Performed at [facility], [address], [place], [place] [address]   Basic metabolic panel     Status: Abnormal   Collection Time: 01/12/19  9:19 PM  Result Value Ref Range   Sodium 138 135 - 145 mmol/L   Potassium 3.6 3.5 - 5.1 mmol/L   Chloride 106 98 - 111 mmol/L   CO2 22 22 - 32 mmol/L   Glucose, Bld 122 (H) 70 - 99 mg/dL   BUN 14 6 - 20 mg/dL   Creatinine, Ser 4.40 0.44 - 1.00 mg/dL   Calcium 9.0 8.9 - 10.2 mg/dL   GFR calc  non Af Amer >60 >60 mL/min   GFR calc Af Amer >60 >60 mL/min   Anion gap 10 5 - 15    Comment: Performed at [facility], [address] [place], [place] [address]  CBC     Status: Abnormal   Collection Time: 01/12/19  9:19 PM  Result Value Ref Range   WBC 11.5 (H) 4.0 - 10.5 K/uL   RBC 4.30 3.87 - 5.11 MIL/uL   Hemoglobin 12.2 12.0 - 15.0 g/dL   HCT 81.1 91.4 - 78.2 %   MCV 90.7 80.0 - 100.0 fL   MCH 28.4 26.0 - 34.0 pg   MCHC 31.3 30.0 - 36.0 g/dL   RDW 95.6 21.3 - 08.6 %   Platelets 294 150 - 400 K/uL   nRBC 0.0 0.0 - 0.2 %    Comment: Performed at [facility], [address] [place], [place] [address]  TSH     Status: None   Collection Time: 01/12/19  9:19 PM  Result Value Ref Range   TSH 1.698 0.350 - 4.500 uIU/mL     Comment: Performed by a 3rd Generation assay with a functional sensitivity of <=0.01 uIU/mL. Performed at [facility], [address], [place], [place] [address]   CBG monitoring, ED     Status: Abnormal   Collection Time: 01/12/19  9:35 PM  Result Value Ref Range   Glucose-Capillary 121 (H) 70 - 99 mg/dL  Troponin I (High Sensitivity)     Status: None   Collection Time: 01/12/19 11:13 PM  Result Value Ref Range   Troponin I (High Sensitivity) <2.00 <18 ng/L    Comment: (NOTE) Elevated high sensitivity troponin I (hsTnI) values and significant  changes across serial measurements may suggest ACS but many other  chronic and acute conditions are known to elevate hsTnI results.  Refer to the Links section for chest pain algorithms and additional  guidance. Performed at [facility], [address], [place], [place] [address]   SARS CORONAVIRUS 2 (TAT 6-24 HRS) Nasopharyngeal Nasopharyngeal Swab     Status: None   Collection Time: 01/12/19 11:13 PM   Specimen: Nasopharyngeal Swab  Result Value Ref Range   SARS Coronavirus 2 NEGATIVE NEGATIVE    Comment: (NOTE) SARS-CoV-2 target nucleic acids are NOT DETECTED. The SARS-CoV-2 RNA is generally detectable in upper and lower respiratory specimens during the acute phase of infection. Negative results do not preclude SARS-CoV-2 infection, do not rule out co-infections with other pathogens, and should not be used as the sole basis for treatment or other patient management decisions. Negative results must be combined with clinical observations, patient history, and epidemiological information. The expected result is Negative. Fact Sheet for Patients: HairSlick.no Fact Sheet for Healthcare Providers: quierodirigir.com This test is not yet approved or cleared by the [place] FDA and  has been authorized for detection and/or diagnosis of SARS-CoV-2  by FDA under an Emergency Use Authorization (EUA). This EUA will remain  in effect (meaning this test can be used) for the duration of the COVID-19 declaration under Section 56 4(b)(1) of the Act, 21 U.S.C. section 360bbb-3(b)(1), unless the authorization is terminated or revoked sooner. Performed at [facility], [address], [place], [place] [address]   hCG, quantitative, pregnancy     Status: None   Collection Time: 01/13/19 12:45 AM  Result Value Ref Range   hCG, Beta Chain, Quant, S <1 <5 mIU/mL    Comment:  GEST. AGE      CONC.  (mIU/mL)   <=1 WEEK        5 - 50     2 WEEKS       50 - 500     3 WEEKS       100 - 10,000     4 WEEKS     1,000 - 30,000     5 WEEKS     3,500 - 115,000   6-8 WEEKS     12,000 - 270,000    12 WEEKS     15,000 - 220,000        FEMALE AND NON-PREGNANT FEMALE:     LESS THAN 5 mIU/mL Performed at [facility], [address] [place], [place] [address]   Lactic acid, plasma     Status: None   Collection Time: 01/13/19  1:22 AM  Result Value Ref Range   Lactic Acid, Venous 0.8 0.5 - 1.9 mmol/L    Comment: Performed at [facility], [address] [place], [place] [address]  Urine rapid drug screen (hosp performed)     Status: Abnormal   Collection Time: 01/13/19  1:22 AM  Result Value Ref Range   Opiates NONE DETECTED NONE DETECTED   Cocaine NONE DETECTED NONE DETECTED   Benzodiazepines NONE DETECTED NONE DETECTED   Amphetamines NONE DETECTED NONE DETECTED   Tetrahydrocannabinol POSITIVE (A) NONE DETECTED   Barbiturates NONE DETECTED NONE DETECTED    Comment: (NOTE) DRUG SCREEN FOR MEDICAL PURPOSES ONLY.  IF CONFIRMATION IS NEEDED FOR ANY PURPOSE, NOTIFY LAB WITHIN 5 DAYS. LOWEST DETECTABLE LIMITS FOR URINE DRUG SCREEN Drug Class                     Cutoff (ng/mL) Amphetamine and metabolites    1000 Barbiturate and metabolites    200 Benzodiazepine                 200 Tricyclics and  metabolites     300 Opiates and metabolites        300 Cocaine and metabolites        300 THC                            50 Performed at [facility], [address] [place], [place] [address]   CK     Status: None   Collection Time: 01/13/19  1:23 AM  Result Value Ref Range   Total CK 60 38 - 234 U/L    Comment: Performed at [facility], [address] [place], [place] [address]  Troponin I (High Sensitivity)     Status: None   Collection Time: 01/13/19  1:23 AM  Result Value Ref Range   Troponin I (High Sensitivity) <2 <18 ng/L    Comment: (NOTE) Elevated high sensitivity troponin I (hsTnI) values and significant  changes across serial measurements may suggest ACS but many other  chronic and acute conditions are known to elevate hsTnI results.  Refer to the Links section for chest pain algorithms and additional  guidance. Performed at [facility], [address] [place], [place] [address]   Hepatic function panel     Status: Abnormal   Collection Time: 01/13/19  1:23 AM  Result Value Ref Range   Total Protein 6.3 (L) 6.5 - 8.1 g/dL   Albumin 3.4 (L) 3.5 - 5.0  g/dL   AST 21 15 - 41 U/L   ALT 16 0 - 44 U/L   Alkaline Phosphatase 47 38 - 126 U/L   Total Bilirubin 0.4 0.3 - 1.2 mg/dL   Bilirubin, Direct 0.2 0.0 - 0.2 mg/dL   Indirect Bilirubin 0.2 (L) 0.3 - 0.9 mg/dL    Comment: Performed at [facility], [address], [place], [place] [address]  Magnesium     Status: None   Collection Time: 01/13/19  1:23 AM  Result Value Ref Range   Magnesium 1.8 1.7 - 2.4 mg/dL    Comment: Performed at [facility], [address] [place], [place] [address]  Phosphorus     Status: None   Collection Time: 01/13/19  1:23 AM  Result Value Ref Range   Phosphorus 3.4 2.5 - 4.6 mg/dL    Comment: Performed at [facility], [address] [place], [place] [address]   Respiratory Panel by PCR     Status: None   Collection Time: 01/13/19  1:23 AM   Specimen: Urine, Clean Catch; Respiratory  Result Value Ref Range   Adenovirus NOT DETECTED NOT DETECTED   Coronavirus 229E NOT DETECTED NOT DETECTED    Comment: (NOTE) The Coronavirus on the Respiratory Panel, DOES NOT test for the novel  Coronavirus (2019 nCoV)    Coronavirus HKU1 NOT DETECTED NOT DETECTED   Coronavirus NL63 NOT DETECTED NOT DETECTED   Coronavirus OC43 NOT DETECTED NOT DETECTED   Metapneumovirus NOT DETECTED NOT DETECTED   Rhinovirus / Enterovirus NOT DETECTED NOT DETECTED   Influenza A NOT DETECTED NOT DETECTED   Influenza B NOT DETECTED NOT DETECTED   Parainfluenza Virus 1 NOT DETECTED NOT DETECTED   Parainfluenza Virus 2 NOT DETECTED NOT DETECTED   Parainfluenza Virus 3 NOT DETECTED NOT DETECTED   Parainfluenza Virus 4 NOT DETECTED NOT DETECTED   Respiratory Syncytial Virus NOT DETECTED NOT DETECTED   Bordetella pertussis NOT DETECTED NOT DETECTED   Chlamydophila pneumoniae NOT DETECTED NOT DETECTED   Mycoplasma pneumoniae NOT DETECTED NOT DETECTED    Comment: Performed at [facility], [address], [place], [place] [address]  APTT     Status: None   Collection Time: 01/13/19  1:32 AM  Result Value Ref Range   aPTT 27 24 - 36 seconds    Comment: Performed at [facility], [address] [place], [place] [address]  CBC WITH DIFFERENTIAL     Status: Abnormal   Collection Time: 01/13/19  9:07 AM  Result Value Ref Range   WBC 6.4 4.0 - 10.5 K/uL   RBC 3.72 (L) 3.87 - 5.11 MIL/uL   Hemoglobin 10.6 (L) 12.0 - 15.0 g/dL   HCT 13.0 (L) 86.5 - 78.4 %   MCV 90.9 80.0 - 100.0 fL   MCH 28.5 26.0 - 34.0 pg   MCHC 31.4 30.0 - 36.0 g/dL   RDW 69.6 29.5 - 28.4 %   Platelets 205 150 - 400 K/uL   nRBC 0.0 0.0 - 0.2 %   Neutrophils Relative % 62 %   Neutro Abs 4.0 1.7 - 7.7 K/uL   Lymphocytes Relative 29 %   Lymphs Abs 1.9 0.7 - 4.0 K/uL   Monocytes  Relative 8 %   Monocytes Absolute 0.5 0.1 - 1.0 K/uL   Eosinophils Relative 1 %   Eosinophils Absolute 0.0 0.0 - 0.5 K/uL   Basophils Relative 0 %   Basophils Absolute 0.0 0.0 -  0.1 K/uL   Immature Granulocytes 0 %   Abs Immature Granulocytes 0.02 0.00 - 0.07 K/uL    Comment: Performed at [facility], [place] [address], [place], [place] [address]  Comprehensive metabolic panel     Status: Abnormal   Collection Time: 01/13/19  9:07 AM  Result Value Ref Range   Sodium 139 135 - 145 mmol/L   Potassium 3.6 3.5 - 5.1 mmol/L   Chloride 111 98 - 111 mmol/L   CO2 22 22 - 32 mmol/L   Glucose, Bld 91 70 - 99 mg/dL   BUN 12 6 - 20 mg/dL   Creatinine, Ser 0.51 0.44 - 1.00 mg/dL   Calcium 7.9 (L) 8.9 - 10.3 mg/dL   Total Protein 5.9 (L) 6.5 - 8.1 g/dL   Albumin 3.2 (L) 3.5 - 5.0 g/dL   AST 16 15 - 41 U/L   ALT 16 0 - 44 U/L   Alkaline Phosphatase 46 38 - 126 U/L   Total Bilirubin 0.7 0.3 - 1.2 mg/dL   GFR calc non Af Amer >60 >60 mL/min   GFR calc Af Amer >60 >60 mL/min   Anion gap 6 5 - 15    Comment: Performed at [facility], [place] [address] [place], [place] [address]  Magnesium     Status: None   Collection Time: 01/13/19  9:07 AM  Result Value Ref Range   Magnesium 1.9 1.7 - 2.4 mg/dL    Comment: Performed at [facility], [place] [address], [place], [place] [address]  TSH     Status: None   Collection Time: 01/13/19  9:07 AM  Result Value Ref Range   TSH 0.536 0.350 - 4.500 uIU/mL    Comment: Performed by a 3rd Generation assay with a functional sensitivity of <=0.01 uIU/mL. Performed at [facility], [place] [address] [place], [place] [address]   Brain natriuretic peptide     Status: None   Collection Time: 01/13/19  9:07 AM  Result Value Ref Range   B Natriuretic Peptide 88.1 0.0 - 100.0 pg/mL    Comment: Performed at [facility], [place] [address], [place], [place] [address]  Ethanol      Status: None   Collection Time: 01/13/19  9:07 AM  Result Value Ref Range   Alcohol, Ethyl (B) <10 <10 mg/dL    Comment: (NOTE) Lowest detectable limit for serum alcohol is 10 mg/dL. For medical purposes only. Performed at [facility], [place] [address], [place], [place] [address]    Ct Angio Head W Or Wo Contrast  Result Date: 01/12/2019 CLINICAL DATA:  Unequal pupils, altered mental status, evaluate for sign of aneurysm; cerebral hemorrhage suspected. EXAM: CT ANGIOGRAPHY HEAD TECHNIQUE: Multidetector CT imaging of the head was performed using the standard protocol during bolus administration of intravenous contrast. Multiplanar CT image reconstructions and MIPs were obtained to evaluate the vascular anatomy. CONTRAST:  154mL OMNIPAQUE IOHEXOL 350 MG/ML SOLN COMPARISON:  No pertinent prior studies available for comparison. FINDINGS: CT HEAD Brain: No evidence of acute intracranial hemorrhage. No demarcated cortical infarction. No evidence of intracranial mass. No midline shift or extra-axial fluid collection. Cerebral volume is normal for age.  Partially empty sella turcica. Vascular: Reported separately Skull: Normal. Negative for fracture or focal lesion. Sinuses: No significant paranasal sinus disease or mastoid effusion Orbits: Visualized orbits demonstrate no acute abnormality. CTA HEAD Anterior circulation: The intracranial internal carotid arteries are patent without significant stenosis.  The right middle and anterior cerebral arteries are patent without significant proximal stenosis. The left middle and anterior cerebral arteries are patent without significant proximal stenosis. No intracranial aneurysm is identified. Posterior circulation: The intracranial vertebral arteries are patent without significant stenosis. The basilar artery is patent without significant stenosis. The bilateral posterior cerebral arteries are patent without significant proximal stenosis. Venous  sinuses: Within limitations of contrast timing, no convincing thrombus. Anatomic variants: None significant. Posterior communicating arteries are present bilaterally. IMPRESSION: CT head: No CT evidence of acute intracranial abnormality. CTA head: 1. No intracranial large vessel occlusion or proximal high-grade arterial stenosis. 2. No intracranial aneurysm identified. Electronically Signed   By: Jackey Loge DO   On: 01/12/2019 23:18   Dg Chest 2 View  Result Date: 01/13/2019 CLINICAL DATA:  Initial evaluation for acute loss of consciousness. EXAM: CHEST - 2 VIEW COMPARISON:  None. FINDINGS: The cardiac and mediastinal silhouettes are within normal limits. The lungs are normally inflated. No airspace consolidation, pleural effusion, or pulmonary edema. No pneumothorax. No acute osseous abnormality. IMPRESSION: No active cardiopulmonary disease. Electronically Signed   By: Rise Mu M.D.   On: 01/13/2019 01:03    Pending Labs Unresulted Labs (From admission, onward)    Start     Ordered   01/13/19 2947  HIV Antibody (routine testing w rflx)  (HIV Antibody (Routine testing w reflex) panel)  Once,   STAT     01/13/19 0822   01/12/19 2119  Urinalysis, Routine w reflex microscopic  Once,   STAT     01/12/19 2118          Vitals/Pain Today's Vitals   01/13/19 1000 01/13/19 1030 01/13/19 1100 01/13/19 1130  BP: 97/68 105/69 105/67 108/70  Pulse: 75 70 78 92  Resp: 16 (!) 9 16 17   Temp:      TempSrc:      SpO2: 99% 100% 100% 100%  Weight:      PainSc:        Isolation Precautions No active isolations  Medications Medications  sodium chloride flush (NS) 0.9 % injection 3 mL (3 mLs Intravenous Not Given 01/13/19 1000)  enoxaparin (LOVENOX) injection 40 mg (40 mg Subcutaneous Not Given 01/13/19 1142)  0.9 %  sodium chloride infusion ( Intravenous New Bag/Given 01/13/19 1000)  acetaminophen (TYLENOL) tablet 650 mg (has no administration in time range)    Or  acetaminophen  (TYLENOL) suppository 650 mg (has no administration in time range)  senna-docusate (Senokot-S) tablet 1 tablet (has no administration in time range)  ondansetron (ZOFRAN) tablet 4 mg (has no administration in time range)    Or  ondansetron (ZOFRAN) injection 4 mg (has no administration in time range)  sodium chloride flush (NS) 0.9 % injection 3 mL (3 mLs Intravenous Given 01/12/19 2145)  sodium chloride (PF) 0.9 % injection (  Given 01/12/19 2332)  iohexol (OMNIPAQUE) 350 MG/ML injection 100 mL (100 mLs Intravenous Contrast Given 01/12/19 2228)  sodium chloride 0.9 % bolus 1,000 mL (0 mLs Intravenous Stopped 01/13/19 0030)  sodium chloride 0.9 % bolus 1,000 mL (0 mLs Intravenous Stopped 01/13/19 0111)  sodium chloride 0.9 % bolus 1,000 mL (0 mLs Intravenous Stopped 01/13/19 0544)    Mobility walks

## 2019-01-13 NOTE — ED Notes (Signed)
As I write this, the hospitalist is seeing her. She also has a Counselling psychologist.

## 2019-01-13 NOTE — ED Notes (Signed)
As I write this, pt. Is undergoing 2D echo. Formerly, she had been sleeping soundly. Monitor shows nsr without ectopy.

## 2019-01-13 NOTE — ED Notes (Signed)
I have just given report to Joellen Jersey, RN on 4th floor. Will transport shortly.

## 2019-01-13 NOTE — Discharge Instructions (Signed)
Syncope °Syncope is when you pass out (faint) for a short time. It is caused by a sudden decrease in blood flow to the brain. Signs that you may be about to pass out include: °· Feeling dizzy or light-headed. °· Feeling sick to your stomach (nauseous). °· Seeing all white or all black. °· Having cold, clammy skin. °If you pass out, get help right away. Call your local emergency services (911 in the [place] Do not drive yourself to the hospital. °Follow these instructions at home: °Watch for any changes in your symptoms. Take these actions to stay safe and help with your symptoms: °Lifestyle °· Do not drive, use machinery, or play sports until your doctor says it is okay. °· Do not drink alcohol. °· Do not use any products that contain nicotine or tobacco, such as cigarettes and e-cigarettes. If you need help quitting, ask your doctor. °· Drink enough fluid to keep your pee (urine) pale yellow. °General instructions °· Take over-the-counter and prescription medicines only as told by your doctor. °· If you are taking blood pressure or heart medicine, sit up and stand up slowly. Spend a few minutes getting ready to sit and then stand. This can help you feel less dizzy. °· Have someone stay with you until you feel stable. °· If you start to feel like you might pass out, lie down right away and raise (elevate) your feet above the level of your heart. Breathe deeply and steadily. Wait until all of the symptoms are gone. °· Keep all follow-up visits as told by your doctor. This is important. °Get help right away if: °· You have a very bad headache. °· You pass out once or more than once. °· You have pain in your chest, belly, or back. °· You have a very fast or uneven heartbeat (palpitations). °· It hurts to breathe. °· You are bleeding from your mouth or your bottom (rectum). °· You have black or tarry poop (stool). °· You have jerky movements that you cannot control (seizure). °· You are confused. °· You have trouble  walking. °· You are very weak. °· You have vision problems. °These symptoms may be an emergency. Do not wait to see if the symptoms will go away. Get medical help right away. Call your local emergency services (911 in the [place] Do not drive yourself to the hospital. °Summary °· Syncope is when you pass out (faint) for a short time. It is caused by a sudden decrease in blood flow to the brain. °· Signs that you may be about to faint include feeling dizzy, light-headed, or sick to your stomach, seeing all white or all black, or having cold, clammy skin. °· If you start to feel like you might pass out, lie down right away and raise (elevate) your feet above the level of your heart. Breathe deeply and steadily. Wait until all of the symptoms are gone. °This information is not intended to replace advice given to you by your health care provider. Make sure you discuss any questions you have with your health care provider. °Document Released: 08/25/2007 Document Revised: 04/20/2017 Document Reviewed: 04/20/2017 °Elsevier Patient Education © 2020 [employer] ° °

## 2019-01-13 NOTE — H&P (Signed)
Rebekah Mclaughlin IRW:[id number] DOB: February 06, 1986 DOA: 01/12/2019     PCP: Patient, No Pcp Per   Outpatient Specialists: NONE    Patient arrived to ER on 01/12/19 at 2109  Patient coming from: home Lives   With family    Chief Complaint:   Chief Complaint  Patient presents with  . Loss of Consciousness    HPI: Rebekah Mclaughlin is a 33 y.o. female with no medical history significant        Presented with loss of consciousness during dinner tonight after having 2 beers. No head injury patient did vomit on her car ride over to the emergency department appears to be pale and weak slow to respond according to husband She delivered a baby in March 2020 Her husband patient was doing okay from today then she sat down and stated that she felt very tired and then put her head down husband attempted to help her get up but she was a dead weight.  She felt extremely lightheaded so unclear if it was to syncope or not she never had prior history of's before she has been extremely exhausted while in emergency department no chest Mclaughlin or shortness of breath no headache no fever Her husband tested positive for Covid in April 2020 and had very mild symptoms at the time Wife and children were never tested but he assumed that they have been exposed and tolerated the illness.  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 She denies any recent fevers or cough but has severe fatigue that has been debilitating. Nausea and vomiting She states that she is  sure that she is at risk for Covid exposure given her line of work Denies any loss of sense of smell and taste. She endorses she drinks rarely only once a week but usually 2 beers would not make her feel as lightheaded as she felt tonight Patient is status post IUD denies possibility of being pregnant She states that lately she has been feeling somewhat more lightheaded especially  when she tries to stand up she has had decreased p.o. intake of fluids She has had somewhat decreased urination and felt better after IV fluid administration  Infectious risk factors:  Reports  severe fatigue, nausea vomiting Regulatory affairs officer   In  [place] TEST  in house testing  Pending  No results found for: [place]   Regarding pertinent Chronic problems: none    While in ER:  The following Work up has been ordered so far:  Orders Placed This Encounter  Procedures  . SARS CORONAVIRUS 2 (TAT 6-24 HRS) Nasopharyngeal Nasopharyngeal Swab  . CT Angio Head W or Wo Contrast  . Basic metabolic panel  . CBC  . Urinalysis, Routine w reflex microscopic  . TSH  . D-dimer, quantitative (not at [facility])  . Cardiac monitoring  . Saline Lock IV, Maintain IV access  . Consult to hospitalist  ALL PATIENTS BEING ADMITTED/HAVING PROCEDURES NEED COVID-19 SCREENING  . Pulse oximetry, continuous  . CBG monitoring, ED  . I-Stat beta hCG blood, ED  . ED EKG  . EKG 12-Lead  . EKG 12-Lead   Following Medications were ordered in ER: Medications  sodium chloride 0.9 % bolus 1,000 mL (has no administration in time range)  sodium chloride flush (NS) 0.9 % injection 3 mL (3 mLs Intravenous Given 01/12/19 2145)  sodium chloride (PF) 0.9 % injection (  Given 01/12/19 2332)  iohexol (OMNIPAQUE) 350 MG/ML injection 100 mL (100 mLs Intravenous Contrast Given 01/12/19 2228)  sodium chloride 0.9 % bolus 1,000 mL (1,000 mLs Intravenous Bolus from Bag 01/12/19 2317)        Consult Orders  (From admission, onward)         Start     Ordered   01/13/19 0010  Consult to hospitalist  ALL PATIENTS BEING ADMITTED/HAVING PROCEDURES NEED COVID-19 SCREENING  Once    Comments: ALL PATIENTS BEING ADMITTED/HAVING PROCEDURES NEED COVID-19 SCREENING  Provider:  (Not yet assigned)  Question Answer Comment  Place call to: [facility]   Reason for Consult Admit   Diagnosis/Clinical Info for  Consult: Near syncope      01/13/19 0009           Significant initial  Findings: Abnormal Labs Reviewed  BASIC METABOLIC PANEL - Abnormal; Notable for the following components:      Result Value   Glucose, Bld 122 (*)    All other components within normal limits  CBC - Abnormal; Notable for the following components:   WBC 11.5 (*)    All other components within normal limits  CBG MONITORING, ED - Abnormal; Notable for the following components:   Glucose-Capillary 121 (*)    All other components within normal limits    Otherwise labs showing:    Recent Labs  Lab 01/12/19 2119  NA 138  K 3.6  CO2 22  GLUCOSE 122*  BUN 14  CREATININE 0.69  CALCIUM 9.0    Cr  Stable,  Lab Results  Component Value Date   CREATININE 0.69 01/12/2019   CREATININE 0.58 12/07/2015    No results for input(s): AST, ALT, ALKPHOS, BILITOT, PROT, ALBUMIN in the last 168 hours. Lab Results  Component Value Date   CALCIUM 9.0 01/12/2019     WBC        Component Value Date/Time   WBC 11.5 (H) 01/12/2019 2119      Plt: Lab Results  Component Value Date   PLT 294 01/12/2019    Lactic Acid, Venous No results found for: LATICACIDVEN    COVID-19 Labs  No results for input(s): DDIMER, FERRITIN, LDH, CRP in the last 72 hours.  No results found for: SARSCOV2NAA    HG/HCT  stable,       Component Value Date/Time   HGB 12.2 01/12/2019 2119   HCT 39.0 01/12/2019 2119    Troponin <2.00 Cardiac Panel (last 3 results) No results for input(s): CKTOTAL, CKMB, TROPONINI, RELINDX in the last 72 hours.     ECG: Ordered Personally reviewed by me showing: HR : 65 Rhythm:  NSR  no evidence of ischemic changes QTC 454   DM  labs:  HbA1C: No results for input(s): HGBA1C in the last 8760 hours.     CBG (last 3)  Recent Labs    01/12/19 2135  GLUCAP 121*       UA   ordered   Urine analysis: No results found for: COLORURINE, APPEARANCEUR, LABSPEC, PHURINE, GLUCOSEU, HGBUR,  BILIRUBINUR, KETONESUR, PROTEINUR, UROBILINOGEN, NITRITE, LEUKOCYTESUR     Ordered  CT A HEAD  NON acute  CXR -  NON acute    ED Triage Vitals  Enc Vitals Group     BP 01/12/19 2136 121/78     Pulse Rate 01/12/19 2136 68     Resp 01/12/19 2136 18     Temp 01/12/19 2139 (!) 96.9 F (36.1 C)  Temp Source 01/12/19 2139 Axillary     SpO2 01/12/19 2136 100 %     Weight 01/12/19 2139 165 lb (74.8 kg)     Height --      Head Circumference --      Peak Flow --      Mclaughlin Score 01/12/19 2117 0     Mclaughlin Loc --      Mclaughlin Edu? --      Excl. in GC? --   TMAX(24)@       Latest  Blood pressure 97/63, pulse 95, temperature 97.6 F (36.4 C), temperature source Oral, resp. rate 18, weight 74.8 kg, SpO2 99 %, unknown if currently breastfeeding.     Hospitalist was called for admission for presyncope   Review of Systems:    Pertinent positives include: fatigue, presyncope  Constitutional:  No weight loss, night sweats, Fevers, chills,  weight loss  HEENT:  No headaches, Difficulty swallowing,Tooth/dental problems,Sore throat,  No sneezing, itching, ear ache, nasal congestion, post nasal drip,  Cardio-vascular:  No chest Mclaughlin, Orthopnea, PND, anasarca, dizziness, palpitations.no Bilateral lower extremity swelling  GI:  No heartburn, indigestion, abdominal Mclaughlin, nausea, vomiting, diarrhea, change in bowel habits, loss of appetite, melena, blood in stool, hematemesis Resp:  no shortness of breath at rest. No dyspnea on exertion, No excess mucus, no productive cough, No non-productive cough, No coughing up of blood.No change in color of mucus.No wheezing. Skin:  no rash or lesions. No jaundice GU:  no dysuria, change in color of urine, no urgency or frequency. No straining to urinate.  No flank Mclaughlin.  Musculoskeletal:  No joint Mclaughlin or no joint swelling. No decreased range of motion. No back Mclaughlin.  Psych:  No change in mood or affect. No depression or anxiety. No memory loss.   Neuro: no localizing neurological complaints, no tingling, no weakness, no double vision, no gait abnormality, no slurred speech, no confusion  All systems reviewed and apart from HOPI all are negative  Past Medical History:   Past Medical History:  Diagnosis Date  . Medical history non-contributory       Past Surgical History:  Procedure Laterality Date  . BREAST ENHANCEMENT SURGERY    . CESAREAN SECTION N/A 06/09/2018   Procedure: CESAREAN SECTION;  Surgeon: Zelphia Cairo, MD;  Location: [facility] ORS;  Service: Obstetrics;  Laterality: N/A;  Primary edc 06/15/18 NKDA  need RNFA  . LASIK    . PERINEOPLASTY N/A 12/18/2015   Procedure: Perineal revision of laceration separation;  Surgeon: Noland Fordyce, MD;  Location: [facility] ORS;  Service: Gynecology;  Laterality: N/A;  . WISDOM TOOTH EXTRACTION      Social History:  Ambulatory  independently       reports that she has never smoked. She has never used smokeless tobacco. She reports that she does not drink alcohol or use drugs.     Family History:   Family History  Problem Relation Age of Onset  . Multiple sclerosis Father   . Breast cancer Maternal Grandmother   . Cancer Paternal Grandmother   . Lung cancer Paternal Grandfather     Allergies: No Known Allergies   Prior to Admission medications   Medication Sig Start Date End Date Taking? Authorizing Provider  acetaminophen (TYLENOL) 325 MG tablet Take 2 tablets (650 mg total) by mouth every 6 (six) hours as needed for mild Mclaughlin. 06/10/18  Yes Harold Hedge, MD  ibuprofen (ADVIL,MOTRIN) 600 MG tablet Take 1 tablet (600 mg total) by mouth  every 6 (six) hours as needed for mild Mclaughlin. 06/10/18  Yes Harold Hedge, MD  oxyCODONE (ROXICODONE) 5 MG immediate release tablet Take 1 tablet (5 mg total) by mouth every 6 (six) hours as needed for severe Mclaughlin. 06/10/18   Harold Hedge, MD  Prenatal Vit-Fe Fumarate-FA (PRENATAL MULTIVITAMIN) TABS tablet Take 1 tablet by mouth daily  at 12 noon.    [provider]   Physical Exam: Blood pressure 97/63, pulse 95, temperature 97.6 F (36.4 C), temperature source Oral, resp. rate 18, weight 74.8 kg, SpO2 99 %, unknown if currently breastfeeding. 1. General:  in No  Acute distress   Chronically ill -appearing 2. Psychological: Alert and  Oriented 3. Head/ENT:       Dry Mucous Membranes                          Head Non traumatic, neck supple                          Normal   Dentition 4. SKIN:   decreased Skin turgor,  Skin clean Dry and intact no rash 5. Heart: Regular rate and rhythm no Murmur, no Rub or gallop 6. Lungs:  no wheezes or crackles   7. Abdomen: Soft,  non-tender, Non distended  bowel sounds present 8. Lower extremities: no clubbing, cyanosis, no  edema 9. Neurologically Grossly intact, moving all 4 extremities equally   10. MSK: Normal range of motion   All other LABS:     Recent Labs  Lab 01/12/19 2119  WBC 11.5*  HGB 12.2  HCT 39.0  MCV 90.7  PLT 294     Recent Labs  Lab 01/12/19 2119  NA 138  K 3.6  CL 106  CO2 22  GLUCOSE 122*  BUN 14  CREATININE 0.69  CALCIUM 9.0     No results for input(s): AST, ALT, ALKPHOS, BILITOT, PROT, ALBUMIN in the last 168 hours.     Cultures: No results found for: SDES, SPECREQUEST, CULT, REPTSTATUS   Radiological Exams on Admission: Ct Angio Head W Or Wo Contrast  Result Date: 01/12/2019 CLINICAL DATA:  Unequal pupils, altered mental status, evaluate for sign of aneurysm; cerebral hemorrhage suspected. EXAM: CT ANGIOGRAPHY HEAD TECHNIQUE: Multidetector CT imaging of the head was performed using the standard protocol during bolus administration of intravenous contrast. Multiplanar CT image reconstructions and MIPs were obtained to evaluate the vascular anatomy. CONTRAST:  OMNIPAQUE IOHEXOL 350 MG/ML SOLN COMPARISON:  No pertinent prior studies available for comparison. FINDINGS: CT HEAD Brain: No evidence of acute intracranial  hemorrhage. No demarcated cortical infarction. No evidence of intracranial mass. No midline shift or extra-axial fluid collection. Cerebral volume is normal for age.  Partially empty sella turcica. Vascular: Reported separately Skull: Normal. Negative for fracture or focal lesion. Sinuses: No significant paranasal sinus disease or mastoid effusion Orbits: Visualized orbits demonstrate no acute abnormality. CTA HEAD Anterior circulation: The intracranial internal carotid arteries are patent without significant stenosis. The right middle and anterior cerebral arteries are patent without significant proximal stenosis. The left middle and anterior cerebral arteries are patent without significant proximal stenosis. No intracranial aneurysm is identified. Posterior circulation: The intracranial vertebral arteries are patent without significant stenosis. The basilar artery is patent without significant stenosis. The bilateral posterior cerebral arteries are patent without significant proximal stenosis. Venous sinuses: Within limitations of contrast timing, no convincing thrombus. Anatomic variants: None significant. Posterior communicating  arteries are present bilaterally. IMPRESSION: CT head: No CT evidence of acute intracranial abnormality. CTA head: 1. No intracranial large vessel occlusion or proximal high-grade arterial stenosis. 2. No intracranial aneurysm identified. Electronically Signed   By: Jackey LogeKyle  Golden DO   On: 01/12/2019 23:18    Chart has been reviewed    Assessment/Plan   33 y.o. female with no medical history significant   Admitted for presyncope severe fatigue and tachycardia  Present on Admission: . Syncope -patient appears to be somewhat tachycardic she is improving with IV fluids and suspect initially dehydrated.  Patient states that she has not been drinking enough fluids lately.  Given severe fatigue - viral prodrome is a possibility pt is at higher than averege risk of exposure will  admit as a PUI  . Dehydration - will rehydrate and follow fluid status  . Tachycardia - likely due to dehydration will check TSh d.dimer   Other plan as per orders.  DVT prophylaxis:  SCD     Code Status:  FULL CODE  as per patient  I had personally discussed CODE STATUS with patient  Family Communication:   Family not at  Bedside    Disposition Plan:     To home once workup is complete and patient is stable                      Consults called: none   Admission status:  ED Disposition    None      Obs      Level of care    tele  For 12H    Precautions:   Airborne and Contact precautions  PPE: Used by the provider:   P100  eye Goggles,  Gloves     Valita Righter 01/13/2019, 1:39 AM    [facility]     after 2 AM please page floor coverage PA If 7AM-7PM, please contact the day team taking care of the patient using [email or web address]

## 2019-01-15 NOTE — Discharge Summary (Signed)
[facility] Discharge Summary   Patient: Rebekah Mclaughlin WUJ:[id number]   PCP: Patient, No Pcp Per DOB: 1985/09/02   Date of admission: 01/12/2019   Date of discharge: 01/13/2019     Discharge Diagnoses:  Principal diagnosis Syncope Active Problems:   Syncope   Dehydration   Tachycardia   Admitted From: Home Disposition:  Home   Recommendations for Outpatient Follow-up:  1. PCP: Follow-up in 1 week 2. Follow up LABS/TEST:  none  Follow-up Information    PCP. Schedule an appointment as soon as possible for a visit in 1 week(s).          Diet recommendation: Regular diet  Activity: The patient is advised to gradually reintroduce usual activities,as tolerated.  Discharge Condition: good  Code Status: Full code   History of present illness: As per the H and P dictated on admission, "Rebekah Mclaughlin is a 33 y.o. female with no medical history significant        Presented with loss of consciousness during dinner tonight after having 2 beers. No head injury patient did vomit on her car ride over to the emergency department appears to be pale and weak slow to respond according to husband She delivered a baby in March 2020 Her husband patient was doing okay from today then she sat down and stated that she felt very tired and then put her head down husband attempted to help her get up but she was a dead weight.  She felt extremely lightheaded so unclear if it was to syncope or not she never had prior history of's before she has been extremely exhausted while in emergency department no chest pain or shortness of breath no headache no fever Her husband tested positive for Covid in April 2020 and had very mild symptoms at the time Wife and children were never tested but he assumed that they have been exposed and tolerated the illness.  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 She denies any  recent fevers or cough but has severe fatigue that has been debilitating. Nausea and vomiting She states that she is  sure that she is at risk for Covid exposure given her line of work Denies any loss of sense of smell and taste. She endorses she drinks rarely only once a week but usually 2 beers would not make her feel as lightheaded as she felt tonight Patient is status post IUD denies possibility of being pregnant She states that lately she has been feeling somewhat more lightheaded especially when she tries to stand up she has had decreased p.o. intake of fluids She has had somewhat decreased urination and felt better after IV fluid administration"  Hospital Course:  Summary of her active problems in the hospital is as following.  Syncope - Orthostatic hypotension Positive marijuana in the UDS. Possible food poisoning with intractable nausea and vomiting.  patient appears to be somewhat tachycardic she is improving with IV fluids and suspect initially dehydrated.   Patient states that she has not been drinking enough fluids lately. With aggressive hydration orthostasis resolved.  Patient was able to ambulate without any complaints. UDS came back positive for marijuana.  Patient clearly denies using any marijuana and suspect that somebody may have put something in there potluck food which might of caused her to have nausea and vomiting. Also possibility that she may have sustained food poisoning or allergic reaction to food as they  were at a potluck.  Given severe fatigue - viral prodrome is a possibility Covid test is negative. Patient and family reports that she a lot of times in the morning has some episodes of near syncope or dizziness. So far while the work-up is unremarkable patient should follow-up with PCP to see if she requires more work-up.  Dehydration -  Given IV fluid.  Improving.  Tolerating oral diet.  Tachycardia  Resolved - likely due to dehydration   Patient  was ambulatory without any assistance. On the day of the discharge the patient's vitals were stable, and no other acute medical condition were reported by patient. the patient was felt safe to be discharge at Home with no therapy needed on discharge.  Consultants: none Procedures: Echocardiogram   DISCHARGE MEDICATION: Allergies as of 01/13/2019   No Known Allergies     Medication List    STOP taking these medications   oxyCODONE 5 MG immediate release tablet Commonly known as: Roxicodone     TAKE these medications   acetaminophen 325 MG tablet Commonly known as: TYLENOL Take 2 tablets (650 mg total) by mouth every 6 (six) hours as needed for mild pain.   ibuprofen 600 MG tablet Commonly known as: ADVIL Take 1 tablet (600 mg total) by mouth every 6 (six) hours as needed for mild pain.   prenatal multivitamin Tabs tablet Take 1 tablet by mouth daily at 12 noon.      No Known Allergies Discharge Instructions    Diet general   Complete by: As directed    Discharge instructions   Complete by: As directed    It is important that you read the given instructions as well as go over your medication list with RN to help you understand your care after this hospitalization.  Please follow-up with PCP in 1-2 weeks.  Please note that NO REFILLS for any discharge medications will be authorized once you are discharged, as it is imperative that you return to your primary care physician (or establish a relationship with a primary care physician if you do not have one) for your aftercare needs so that they can reassess your need for medications and monitor your lab values.  Please request your primary care physician to go over all Hospital Tests and Procedure/Radiological results at the follow up. Please get all Hospital records sent to your PCP by signing hospital release before you go home.   Do not drive, operating heavy machinery, perform activities at heights, swimming or  participation in water activities or provide baby sitting services because you were admitted for syncope; until you have been seen by Primary Care Physician or a Neurologist and are cleared to do such activities.  Do not take more than prescribed Pain, Sleep and Anxiety Medications.  You were cared for by a hospitalist during your hospital stay. If you have any questions about your discharge medications or the care you received while you were in the hospital after you are discharged, you can call the unit @UNIT @ you were admitted to and ask to speak with the hospitalist . Ask for Hospitalist on call if the hospitalist that took care of you is not available.   Once you are discharged, your primary care physician will handle any further medical issues.  You Must read complete instructions/literature along with all the possible adverse reactions/side effects for all the Medicines you take and that have been prescribed to you. Take any new Medicines after you have completely understood and  accept all the possible adverse reactions/side effects.  If you have smoked or chewed Tobacco in the last 2 yrs please STOP smoking STOP any Recreational drug use.  If you drink alcohol, please safely reduce the use. Do not drive, operating heavy machinery, perform activities at heights, swimming or participation in water activities or provide baby sitting services under influence.  Wear Seat belts while driving.   Increase activity slowly   Complete by: As directed      Discharge Exam: Filed Weights   01/12/19 2139  Weight: 74.8 kg   Vitals:   01/13/19 1130 01/13/19 1343  BP: 108/70 114/70  Pulse: 92 83  Resp: 17 18  Temp:  98 F (36.7 C)  SpO2: 100% 100%   General: Appear in no distress, no Rash; Oral Mucosa Clear, moist. no Abnormal Mass Or lumps Cardiovascular: S1 and S2 Present, no Murmur, Respiratory: normal respiratory effort, Bilateral Air entry present and Clear to Auscultation,  no Crackles, no wheezes Abdomen: Bowel Sound present, Soft and no tenderness, no hernia Extremities: no Pedal edema, no calf tenderness Neurology: alert and oriented to time, place, and person affect appropriate.  The results of significant diagnostics from this hospitalization (including imaging, microbiology, ancillary and laboratory) are listed below for reference.    Significant Diagnostic Studies: Ct Angio Head W Or Wo Contrast  Result Date: 01/12/2019 CLINICAL DATA:  Unequal pupils, altered mental status, evaluate for sign of aneurysm; cerebral hemorrhage suspected. EXAM: CT ANGIOGRAPHY HEAD TECHNIQUE: Multidetector CT imaging of the head was performed using the standard protocol during bolus administration of intravenous contrast. Multiplanar CT image reconstructions and MIPs were obtained to evaluate the vascular anatomy. CONTRAST:  OMNIPAQUE IOHEXOL 350 MG/ML SOLN COMPARISON:  No pertinent prior studies available for comparison. FINDINGS: CT HEAD Brain: No evidence of acute intracranial hemorrhage. No demarcated cortical infarction. No evidence of intracranial mass. No midline shift or extra-axial fluid collection. Cerebral volume is normal for age.  Partially empty sella turcica. Vascular: Reported separately Skull: Normal. Negative for fracture or focal lesion. Sinuses: No significant paranasal sinus disease or mastoid effusion Orbits: Visualized orbits demonstrate no acute abnormality. CTA HEAD Anterior circulation: The intracranial internal carotid arteries are patent without significant stenosis. The right middle and anterior cerebral arteries are patent without significant proximal stenosis. The left middle and anterior cerebral arteries are patent without significant proximal stenosis. No intracranial aneurysm is identified. Posterior circulation: The intracranial vertebral arteries are patent without significant stenosis. The basilar artery is patent without significant stenosis.  The bilateral posterior cerebral arteries are patent without significant proximal stenosis. Venous sinuses: Within limitations of contrast timing, no convincing thrombus. Anatomic variants: None significant. Posterior communicating arteries are present bilaterally. IMPRESSION: CT head: No CT evidence of acute intracranial abnormality. CTA head: 1. No intracranial large vessel occlusion or proximal high-grade arterial stenosis. 2. No intracranial aneurysm identified. Electronically Signed   By: Jackey Loge DO   On: 01/12/2019 23:18   Dg Chest 2 View  Result Date: 01/13/2019 CLINICAL DATA:  Initial evaluation for acute loss of consciousness. EXAM: CHEST - 2 VIEW COMPARISON:  None. FINDINGS: The cardiac and mediastinal silhouettes are within normal limits. The lungs are normally inflated. No airspace consolidation, pleural effusion, or pulmonary edema. No pneumothorax. No acute osseous abnormality. IMPRESSION: No active cardiopulmonary disease. Electronically Signed   By: Rise Mu M.D.   On: 01/13/2019 01:03    Microbiology: Recent Results (from the past 240 hour(s))  SARS CORONAVIRUS 2 (TAT 6-24 HRS) Nasopharyngeal  Nasopharyngeal Swab     Status: None   Collection Time: 01/12/19 11:13 PM   Specimen: Nasopharyngeal Swab  Result Value Ref Range Status   SARS Coronavirus 2 NEGATIVE NEGATIVE Final    Comment: (NOTE) SARS-CoV-2 target nucleic acids are NOT DETECTED. The SARS-CoV-2 RNA is generally detectable in upper and lower respiratory specimens during the acute phase of infection. Negative results do not preclude SARS-CoV-2 infection, do not rule out co-infections with other pathogens, and should not be used as the sole basis for treatment or other patient management decisions. Negative results must be combined with clinical observations, patient history, and epidemiological information. The expected result is Negative. Fact Sheet for  Patients: SugarRoll.be Fact Sheet for Healthcare Providers: https://www.woods-mathews.com/ This test is not yet approved or cleared by the [place] FDA and  has been authorized for detection and/or diagnosis of SARS-CoV-2 by FDA under an Emergency Use Authorization (EUA). This EUA will remain  in effect (meaning this test can be used) for the duration of the COVID-19 declaration under Section 56 4(b)(1) of the Act, 21 U.S.C. section 360bbb-3(b)(1), unless the authorization is terminated or revoked sooner. Performed at [facility], [place] [address] [place], [place] [address]   Respiratory Panel by PCR     Status: None   Collection Time: 01/13/19  1:23 AM   Specimen: Urine, Clean Catch; Respiratory  Result Value Ref Range Status   Adenovirus NOT DETECTED NOT DETECTED Final   Coronavirus 229E NOT DETECTED NOT DETECTED Final    Comment: (NOTE) The Coronavirus on the Respiratory Panel, DOES NOT test for the novel  Coronavirus (2019 nCoV)    Coronavirus HKU1 NOT DETECTED NOT DETECTED Final   Coronavirus NL63 NOT DETECTED NOT DETECTED Final   Coronavirus OC43 NOT DETECTED NOT DETECTED Final   Metapneumovirus NOT DETECTED NOT DETECTED Final   Rhinovirus / Enterovirus NOT DETECTED NOT DETECTED Final   Influenza A NOT DETECTED NOT DETECTED Final   Influenza B NOT DETECTED NOT DETECTED Final   Parainfluenza Virus 1 NOT DETECTED NOT DETECTED Final   Parainfluenza Virus 2 NOT DETECTED NOT DETECTED Final   Parainfluenza Virus 3 NOT DETECTED NOT DETECTED Final   Parainfluenza Virus 4 NOT DETECTED NOT DETECTED Final   Respiratory Syncytial Virus NOT DETECTED NOT DETECTED Final   Bordetella pertussis NOT DETECTED NOT DETECTED Final   Chlamydophila pneumoniae NOT DETECTED NOT DETECTED Final   Mycoplasma pneumoniae NOT DETECTED NOT DETECTED Final    Comment: Performed at [facility], [place] [address], [place],  [address]      Labs: CBC: Recent Labs  Lab 01/12/19 2119 01/13/19 0907  WBC 11.5* 6.4  NEUTROABS  --  4.0  HGB 12.2 10.6*  HCT 39.0 33.8*  MCV 90.7 90.9  PLT 294 976   Basic Metabolic Panel: Recent Labs  Lab 01/12/19 2119 01/13/19 0123 01/13/19 0907  NA 138  --  139  K 3.6  --  3.6  CL 106  --  111  CO2 22  --  22  GLUCOSE 122*  --  91  BUN 14  --  12  CREATININE 0.69  --  0.51  CALCIUM 9.0  --  7.9*  MG  --  1.8 1.9  PHOS  --  3.4  --    Liver Function Tests: Recent Labs  Lab 01/13/19 0123 01/13/19 0907  AST 21 16  ALT 16 16  ALKPHOS 47 46  BILITOT 0.4 0.7  PROT 6.3* 5.9*  ALBUMIN 3.4* 3.2*   No results for input(s): LIPASE, AMYLASE in the last 168 hours. No results for input(s): AMMONIA in the last 168 hours. Cardiac Enzymes: Recent Labs  Lab 01/13/19 0123  CKTOTAL 60   BNP (last 3 results) Recent Labs    01/13/19 0907  BNP 88.1   CBG: Recent Labs  Lab 01/12/19 2135  GLUCAP 121*    Time spent: 35 minutes  Signed:  Lynden Oxfordranav Khiree Bukhari  [facility] 01/13/2019 8:44 AM

## 2020-04-10 ENCOUNTER — Ambulatory Visit (HOSPITAL_COMMUNITY): Payer: Self-pay

## 2020-12-10 ENCOUNTER — Other Ambulatory Visit: Payer: Self-pay | Admitting: Obstetrics & Gynecology

## 2020-12-10 DIAGNOSIS — R898 Other abnormal findings in specimens from other organs, systems and tissues: Secondary | ICD-10-CM

## 2020-12-25 ENCOUNTER — Ambulatory Visit
Admission: RE | Admit: 2020-12-25 | Discharge: 2020-12-25 | Disposition: A | Discharge status: Home / Self Care | Payer: BC Managed Care – PPO | Source: Ambulatory Visit | Attending: Obstetrics & Gynecology | Admitting: Obstetrics & Gynecology

## 2020-12-25 ENCOUNTER — Other Ambulatory Visit: Payer: Self-pay

## 2020-12-25 DIAGNOSIS — R898 Other abnormal findings in specimens from other organs, systems and tissues: Secondary | ICD-10-CM

## 2020-12-25 IMAGING — MR MR BREAST BILAT WO/W CM
8 of 12 series · 33 of 48 positions shown · IV contrast (7ML GADAVIST)
Comparison: No prior MRI available for comparison. Correlation made
with her prior screening mammogram.

CLINICAL DATA: High risk screening MRI. The patient has family
history of breast cancer in her grandmother and her aunt.

LABS:  None.
EXAM:
BILATERAL BREAST MRI WITH AND WITHOUT CONTRAST
TECHNIQUE: Multiplanar, multisequence MR images of both breasts were obtained
prior to and following the intravenous administration of 7 ml of
Gadavist

[Series 2: t2_tirm_tra ipat (a-p) · axial · 3.0mm · 0.70mm/px · 1 of 55 slices shown]
[im 1/55]
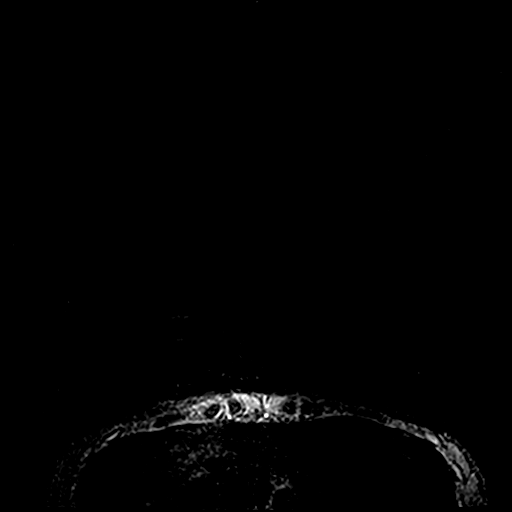

[Series 3: fl3d pre-cm no · axial · non-contrast · 1.2mm · 0.94mm/px · z∈[-84,+88]mm · 5 of 144 slices shown]
[im 1/144]
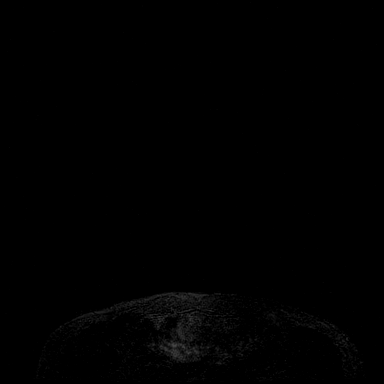
[im 36/144]
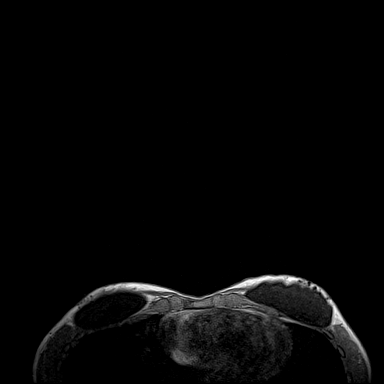
[im 72/144]
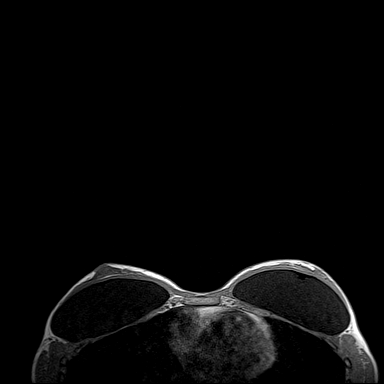
[im 108/144]
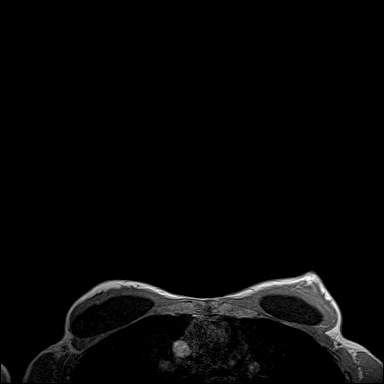
[im 144/144]
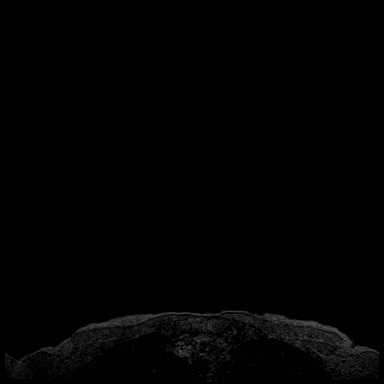

[Series 4: fl3d pre-cm · axial · non-contrast · 1.2mm · 0.94mm/px · z∈[-84,+88]mm · 5 of 144 slices shown]
[im 1/144]
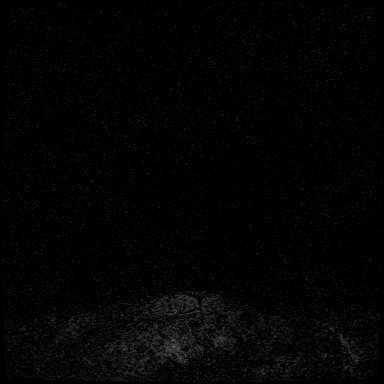
[im 36/144]
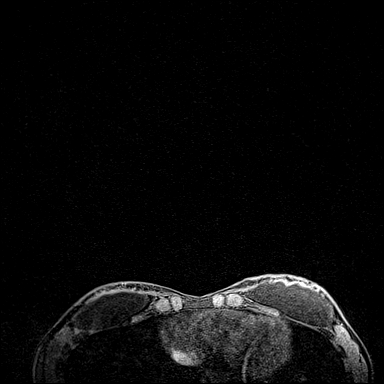
[im 72/144]
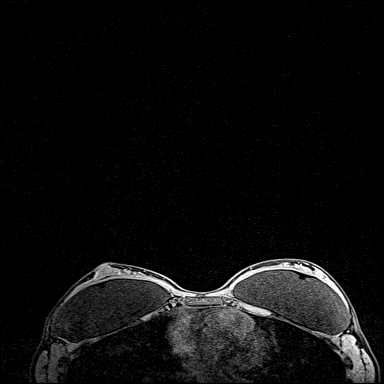
[im 108/144]
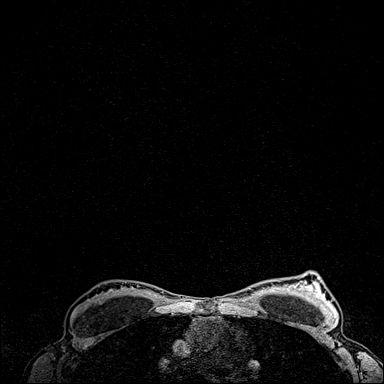
[im 144/144]
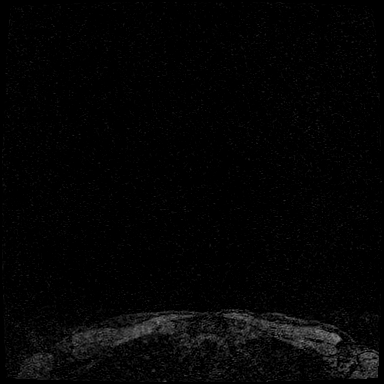

[Series 5: fl3d post immediate · axial · 1.2mm · 0.94mm/px · z∈[-84,+88]mm · 5 of 144 slices shown (1 of 3)]
[im 1/144]
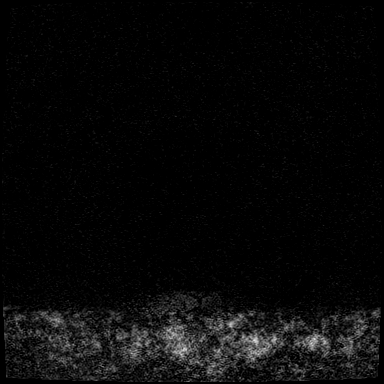
[im 36/144]
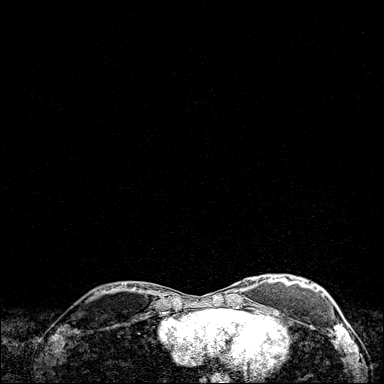
[im 72/144]
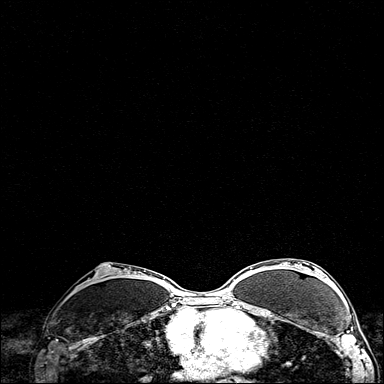
[im 108/144]
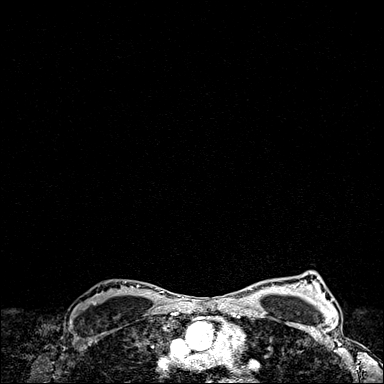
[im 144/144]
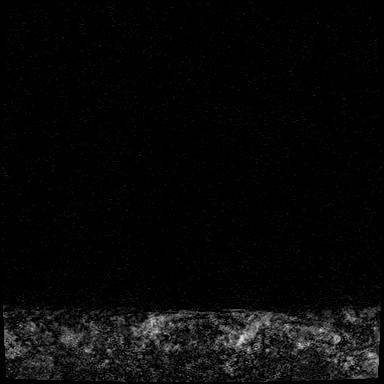

[Series 6: fl3d post immediate · axial · 1.2mm · 0.94mm/px · z∈[-84,+88]mm · 5 of 144 slices shown (2 of 3)]
[im 1/144]
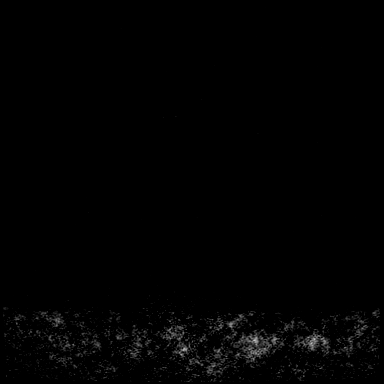
[im 36/144]
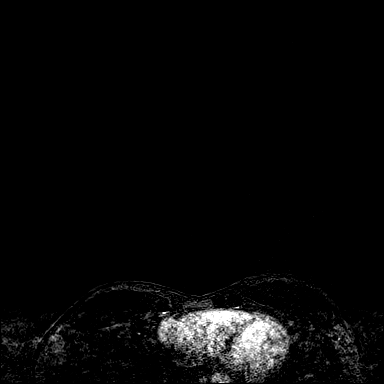
[im 72/144]
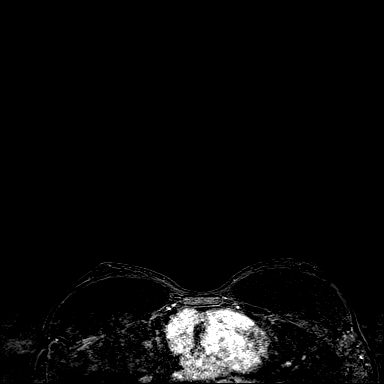
[im 108/144]
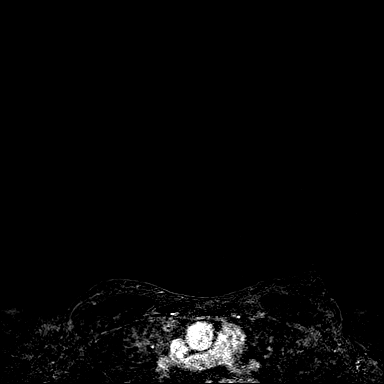
[im 144/144]
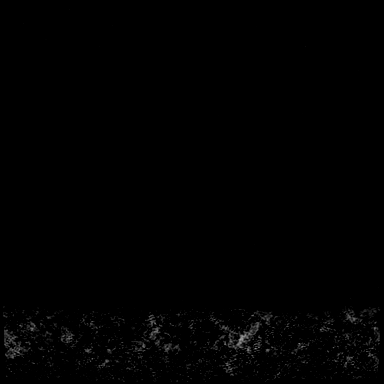

[Series 7: fl3d post immediate · axial · 172.8mm · 0.94mm/px · 1 of 1 slices shown (3 of 3)]
[im 1/1]
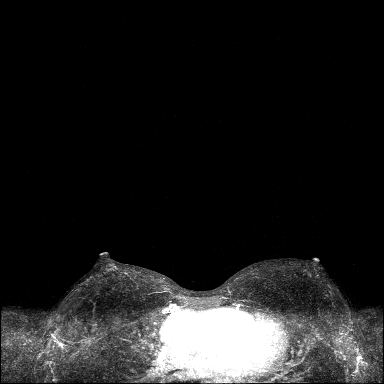

[Series 8: fl3d post 3min · axial · 1.2mm · 0.94mm/px · z∈[-84,+88]mm · 6 of 144 slices shown]
[im 1/144]
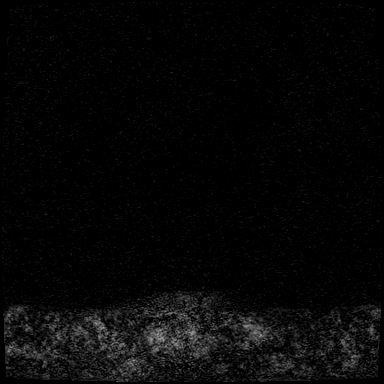
[im 29/144]
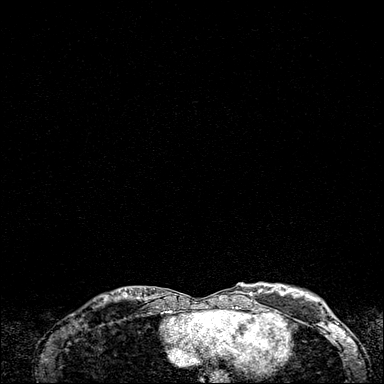
[im 58/144]
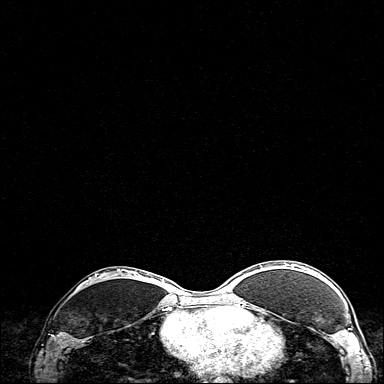
[im 86/144]
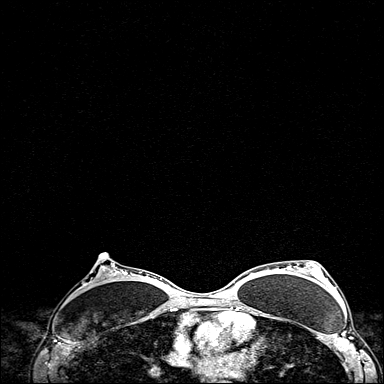
[im 115/144]
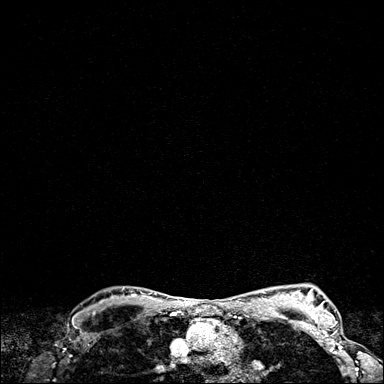
[im 144/144]
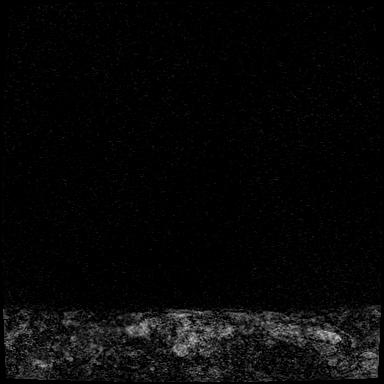

[Series 9: fl3d post 3min_sub · axial · 1.2mm · 0.94mm/px · z∈[-84,+53]mm · 5 of 144 slices shown]
[im 1/144]
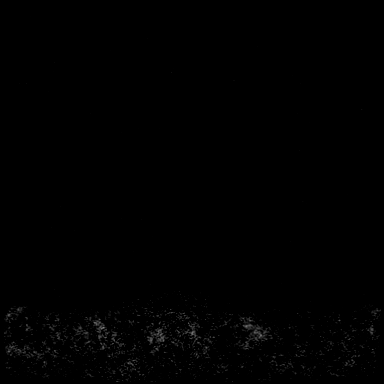
[im 29/144]
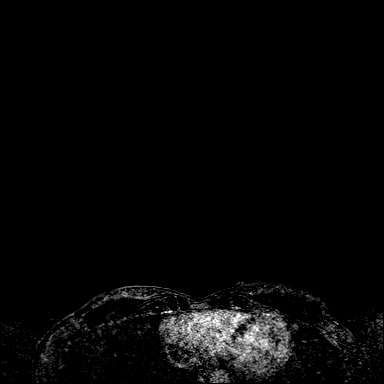
[im 58/144]
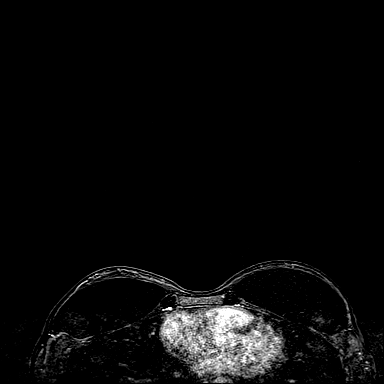
[im 86/144]
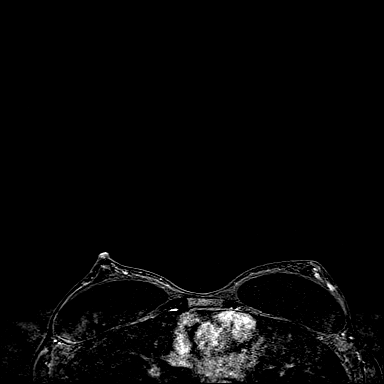
[im 115/144]
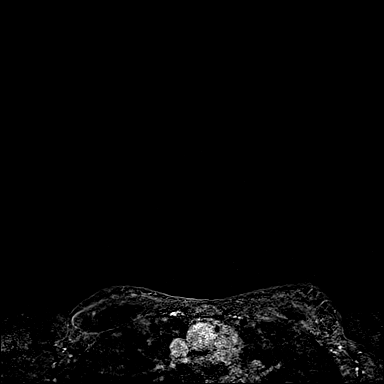

[33 of 48 positions shown; findings below may reference images not displayed]

Three-dimensional MR images were rendered by post-processing of the
original MR data on an independent workstation. The
three-dimensional MR images were interpreted, and findings are
reported in the following complete MRI report for this study. Three
dimensional images were evaluated at the independent interpreting
workstation using the DynaCAD thin client.
FINDINGS: Breast composition: d. Extreme fibroglandular tissue.

Background parenchymal enhancement: Mild

Right breast: In the lower inner retroareolar right breast, there is
a 1 cm area of asymmetric non mass enhancement with persistent
kinetics (series 8, image 68). No other mass or abnormal
enhancement is identified.

Left breast: No mass or abnormal enhancement.

Lymph nodes: No abnormal appearing lymph nodes.

Ancillary findings:  None.
IMPRESSION: 1. There is a 1 cm area of asymmetric non mass enhancement in the
lower inner retroareolar right breast.

2.  No mammographic evidence of malignancy in the bilateral breasts.

RECOMMENDATION:
1. Targeted ultrasound of the retroareolar right breast is
recommended for the non mass enhancement. If no sonographic
abnormalities are found, a six-month follow-up breast MRI is
recommended.

BI-RADS CATEGORY  3: Probably benign.

## 2020-12-25 MED ORDER — GADOBUTROL 1 MMOL/ML IV SOLN
7.0000 mL | Freq: Once | INTRAVENOUS | Status: AC | PRN
  Administered 2020-12-25: 7 mL via INTRAVENOUS

## 2020-12-30 ENCOUNTER — Other Ambulatory Visit: Payer: Self-pay | Admitting: Obstetrics & Gynecology

## 2020-12-30 DIAGNOSIS — R9389 Abnormal findings on diagnostic imaging of other specified body structures: Secondary | ICD-10-CM

## 2021-01-08 ENCOUNTER — Ambulatory Visit
Admission: RE | Admit: 2021-01-08 | Discharge: 2021-01-08 | Disposition: A | Discharge status: Home / Self Care | Payer: BC Managed Care – PPO | Source: Ambulatory Visit | Attending: Obstetrics & Gynecology | Admitting: Obstetrics & Gynecology

## 2021-01-08 ENCOUNTER — Other Ambulatory Visit: Payer: Self-pay

## 2021-01-08 DIAGNOSIS — R9389 Abnormal findings on diagnostic imaging of other specified body structures: Secondary | ICD-10-CM

## 2021-01-08 IMAGING — US US BREAST*R* LIMITED INC AXILLA
1 series · 2 of 2 positions shown · non-contrast
Comparison: [DATE] breast MR and [DATE] screening mammogram
COMPARISON: [DATE] breast MR and [DATE] screening mammogram

Addendum:
CLINICAL DATA: 35-year-old female for sonographic evaluation of
indeterminate 1 cm area of non masslike enhancement within the LOWER
INNER RETROAREOLAR RIGHT breast.

EXAM:
ULTRASOUND OF THE LEFT BREAST

[Series 1: us breast*right* limited inc axilla · 0.04mm/px · 2 of 2 slices shown]
[im 1/2]
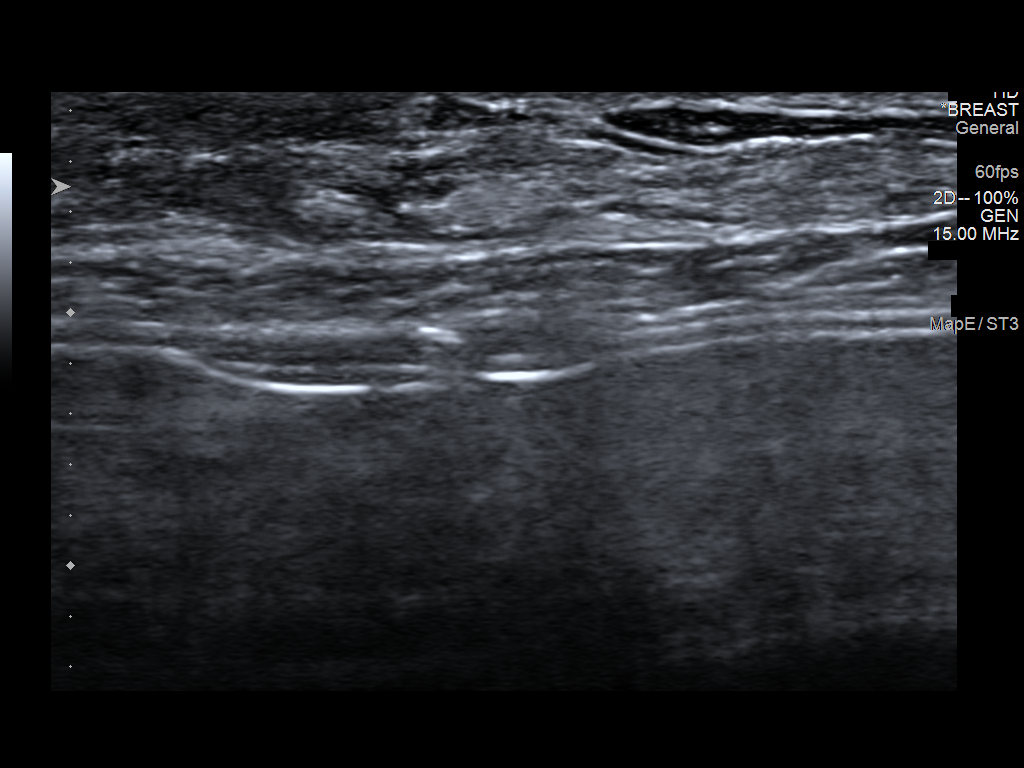
[im 2/2]
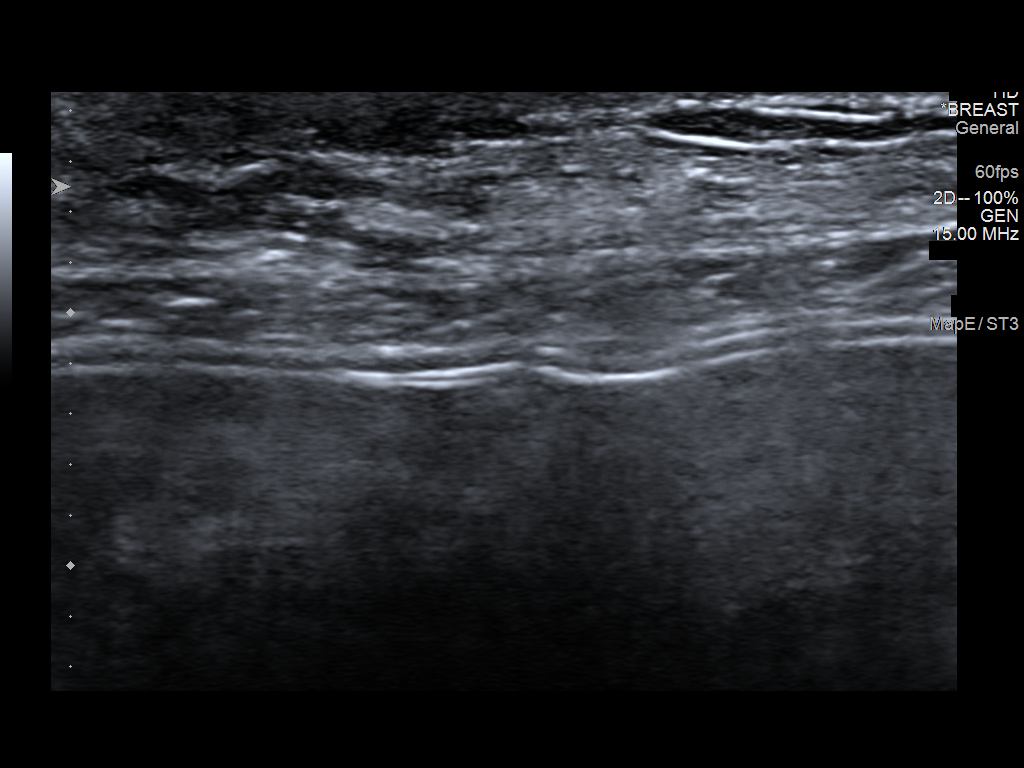

[2 of 2 positions shown; findings below may reference images not displayed]

FINDINGS: On physical exam, no palpable abnormalities in the LOWER INNER RIGHT
breast identified.

Targeted ultrasound is performed, showing no solid mass, distortion
or abnormal shadowing within the LOWER INNER or RETROAREOLAR RIGHT
breast. Normal fibroglandular tissue is identified in the expected
location of the MR abnormality.
IMPRESSION: 1. No sonographic abnormality within the LOWER INNER or RETROAREOLAR
RIGHT breast in the area of the 1 cm non masslike RIGHT breast
enhancement on MR. Although the MR abnormality is more likely to
represent normal fibroglandular tissue given no sonographic or
mammographic correlate, the MR findings are indeterminate. Given the
location of this abnormality, MR guided biopsy would likely not be
technically feasible due to far anterior position and thinness of
the breast tissue. Options of surgical consultation, very short-term
MR follow-up (3 months) and attempt at MR biopsy were presented to
the patient. At this time we will pursue surgical consultation for
possible excisional biopsy. If desired, we can attempt an MR guided
RIGHT breast biopsy. If the MR guided biopsy cannot be technically
performed, then a biopsy clip can be placed into the area of the MR
abnormality for subsequent localization.

RECOMMENDATION:
Surgical consultation of the RIGHT breast as discussed above.If
desired, we can attempt an MR guided RIGHT breast biopsy. If the MR
guided biopsy cannot be technically performed, then a biopsy clip
can be placed into the area of the MR abnormality for subsequent
localization.

I have discussed the findings and recommendations with the patient.
If applicable, a reminder letter will be sent to the patient
regarding the next appointment.

BI-RADS CATEGORY  1: Negative.

ADDENDUM:
Surgical consultation has been arranged with Dr. ERINIJUS
at [REDACTED] on [DATE].

ERINIJUS RN on [DATE]

*** End of Addendum ***
FINDINGS: On physical exam, no palpable abnormalities in the LOWER INNER RIGHT
breast identified.

Targeted ultrasound is performed, showing no solid mass, distortion
or abnormal shadowing within the LOWER INNER or RETROAREOLAR RIGHT
breast. Normal fibroglandular tissue is identified in the expected
location of the MR abnormality.
IMPRESSION: 1. No sonographic abnormality within the LOWER INNER or RETROAREOLAR
RIGHT breast in the area of the 1 cm non masslike RIGHT breast
enhancement on MR. Although the MR abnormality is more likely to
represent normal fibroglandular tissue given no sonographic or
mammographic correlate, the MR findings are indeterminate. Given the
location of this abnormality, MR guided biopsy would likely not be
technically feasible due to far anterior position and thinness of
the breast tissue. Options of surgical consultation, very short-term
MR follow-up (3 months) and attempt at MR biopsy were presented to
the patient. At this time we will pursue surgical consultation for
possible excisional biopsy. If desired, we can attempt an MR guided
RIGHT breast biopsy. If the MR guided biopsy cannot be technically
performed, then a biopsy clip can be placed into the area of the MR
abnormality for subsequent localization.

RECOMMENDATION:
Surgical consultation of the RIGHT breast as discussed above.If
desired, we can attempt an MR guided RIGHT breast biopsy. If the MR
guided biopsy cannot be technically performed, then a biopsy clip
can be placed into the area of the MR abnormality for subsequent
localization.

I have discussed the findings and recommendations with the patient.
If applicable, a reminder letter will be sent to the patient
regarding the next appointment.

BI-RADS CATEGORY  1: Negative.

## 2021-05-06 ENCOUNTER — Other Ambulatory Visit: Payer: Self-pay | Admitting: General Surgery

## 2021-05-06 DIAGNOSIS — Z1239 Encounter for other screening for malignant neoplasm of breast: Secondary | ICD-10-CM

## 2021-05-06 DIAGNOSIS — R928 Other abnormal and inconclusive findings on diagnostic imaging of breast: Secondary | ICD-10-CM

## 2021-05-18 ENCOUNTER — Ambulatory Visit
Admission: RE | Admit: 2021-05-18 | Discharge: 2021-05-18 | Disposition: A | Discharge status: Home / Self Care | Payer: BC Managed Care – PPO | Source: Ambulatory Visit | Attending: General Surgery | Admitting: General Surgery

## 2021-05-18 ENCOUNTER — Other Ambulatory Visit: Payer: Self-pay

## 2021-05-18 DIAGNOSIS — Z1239 Encounter for other screening for malignant neoplasm of breast: Secondary | ICD-10-CM

## 2021-05-18 DIAGNOSIS — R928 Other abnormal and inconclusive findings on diagnostic imaging of breast: Secondary | ICD-10-CM

## 2021-05-18 IMAGING — MR MR BREAST BILAT WO/W CM
8 of 10 series · 32 of 48 positions shown · IV contrast (8 ML GADAVIST)
Comparison: [DATE]

CLINICAL DATA: Followup for non mass enhancement in the RIGHT
breast. Patient had MRI performed for high risk screening on
[DATE], demonstrating non mass enhancement in the LOWER INNER
retroareolar RIGHT breast. On MR directed targeted ultrasound, there
was no sonographic correlate for this finding. Because of the
location of the abnormality, patient was not a candidate for MR
guided core biopsy.

EXAM:
BILATERAL BREAST MRI WITH AND WITHOUT CONTRAST
TECHNIQUE: Multiplanar, multisequence MR images of both breasts were obtained
prior to and following the intravenous administration of 8 ml of
Gadavist

[Series 4: fl3d pre-cm no · axial · non-contrast · 1.2mm · 0.78mm/px · z∈[-143,+67]mm · 6 of 176 slices shown]
[im 1/176]
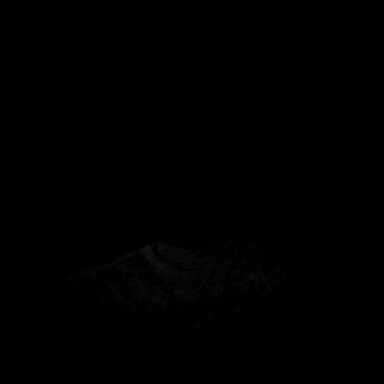
[im 36/176]
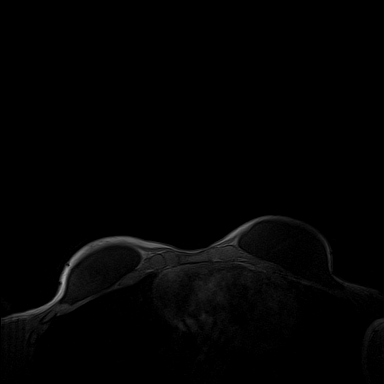
[im 71/176]
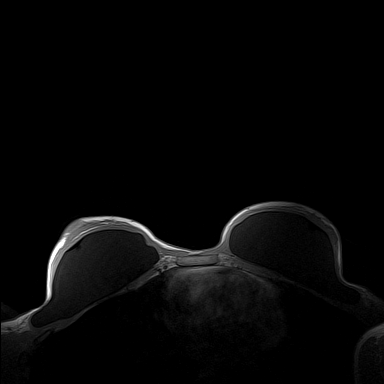
[im 106/176]
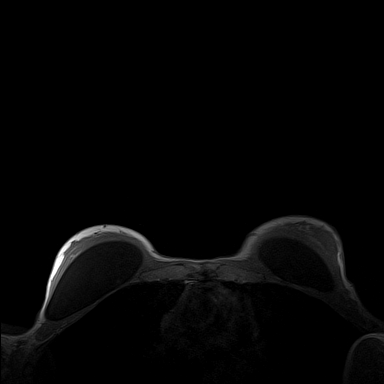
[im 141/176]
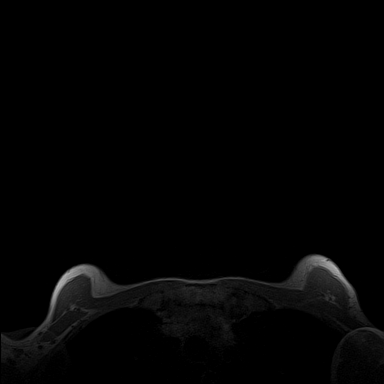
[im 176/176]
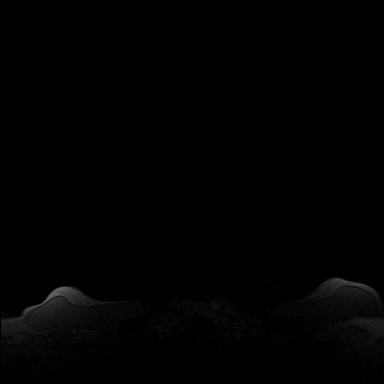

[Series 5: t2_tirm_tra ipat (a-p) · axial · 3.0mm · 0.59mm/px · z∈[-138,+63]mm · 2 of 68 slices shown]
[im 1/68]
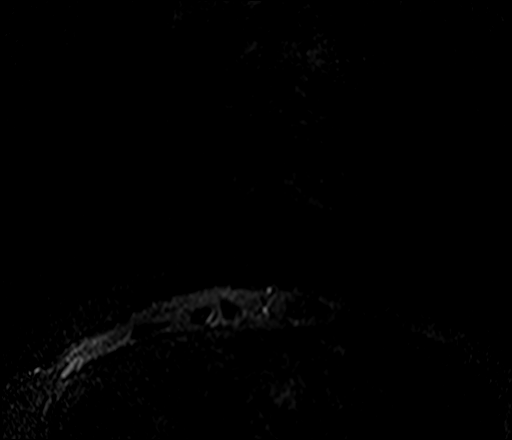
[im 68/68]
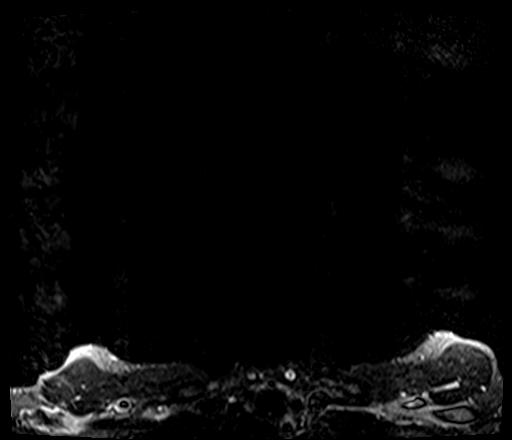

[Series 9: fl3d pre-cm · axial · non-contrast · 1.2mm · 0.89mm/px · z∈[-59,+113]mm · 5 of 144 slices shown]
[im 1/144]
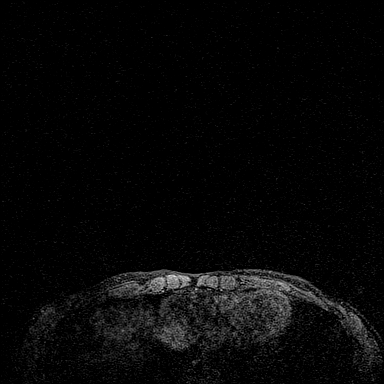
[im 36/144]
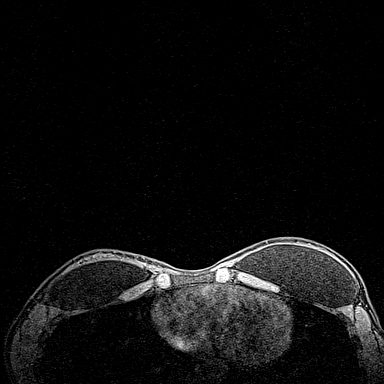
[im 72/144]
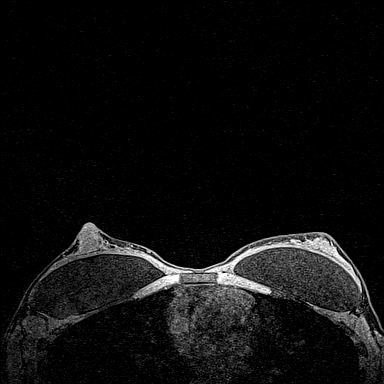
[im 108/144]
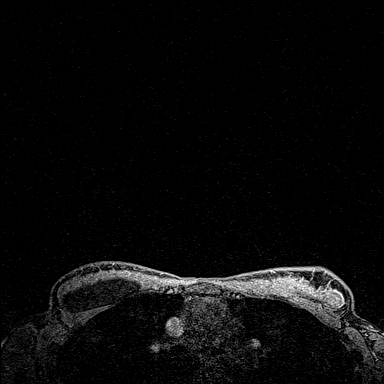
[im 144/144]
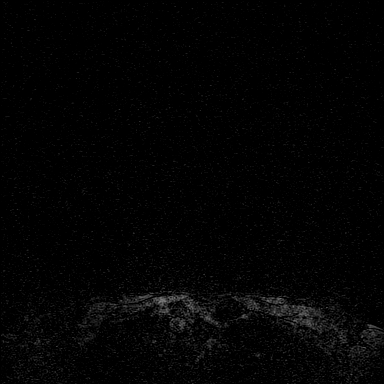

[Series 10: fl3d post-cm 20 · axial · 1.2mm · 0.89mm/px · z∈[-59,+113]mm · 5 of 144 slices shown (1 of 3)]
[im 1/144]
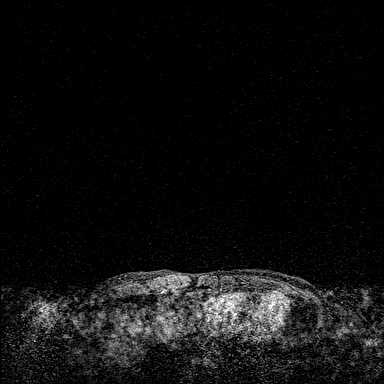
[im 36/144]
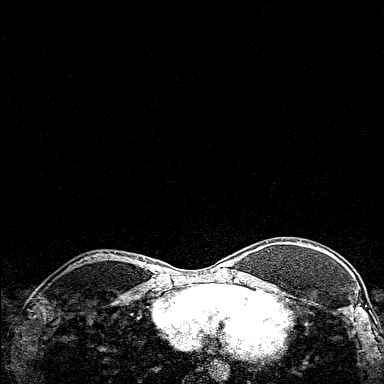
[im 72/144]
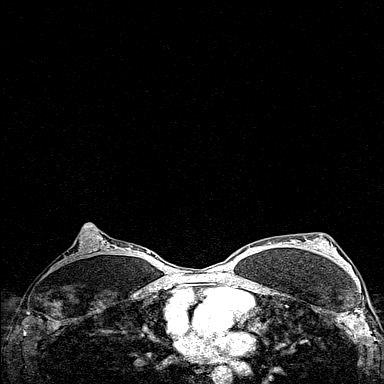
[im 108/144]
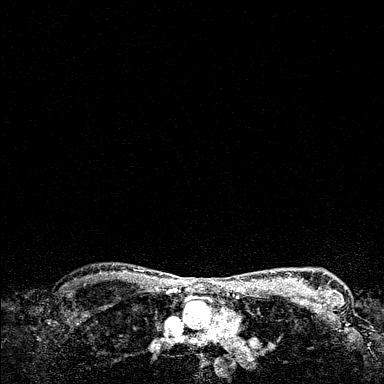
[im 144/144]
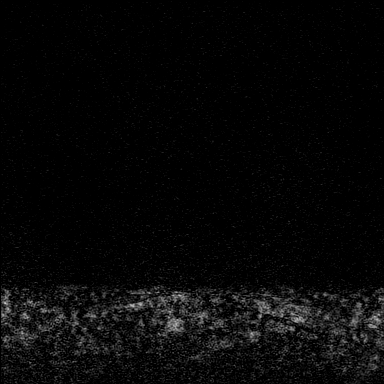

[Series 11: fl3d post-cm 20 · axial · 1.2mm · 0.89mm/px · z∈[-59,+113]mm · 5 of 144 slices shown (2 of 3)]
[im 1/144]
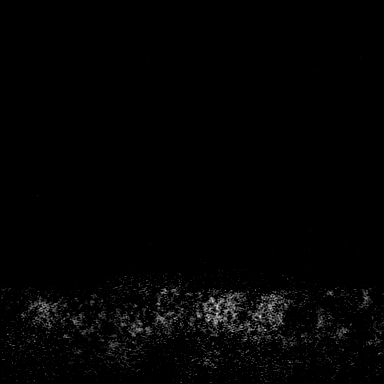
[im 36/144]
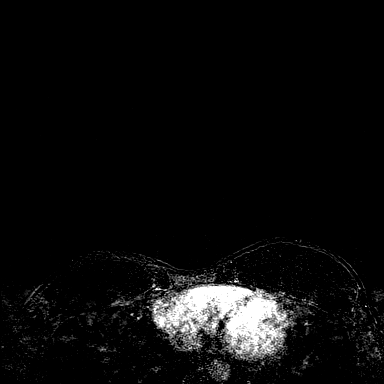
[im 72/144]
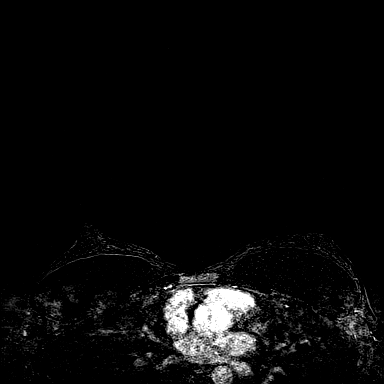
[im 108/144]
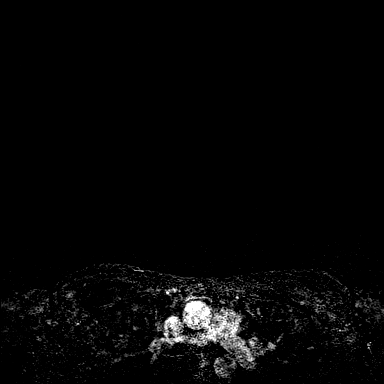
[im 144/144]
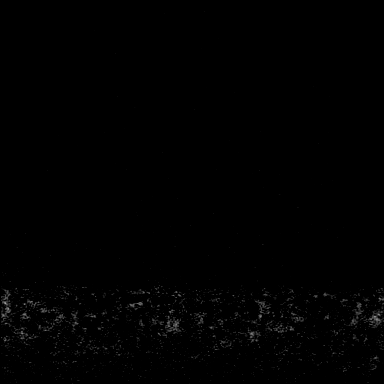

[Series 12: fl3d post-cm 20 · axial · 172.8mm · 0.89mm/px · 1 of 1 slices shown (3 of 3)]
[im 1/1]
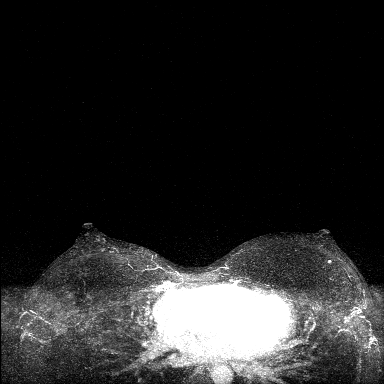

[Series 13: fl3d post-cm 3min · axial · 1.2mm · 0.89mm/px · z∈[-59,+113]mm · 6 of 144 slices shown]
[im 1/144]
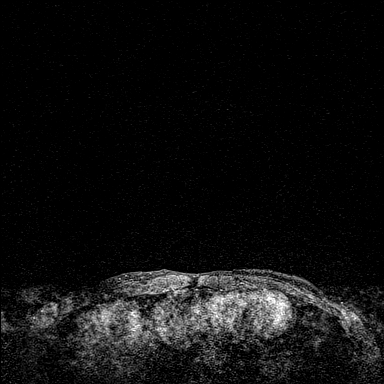
[im 29/144]
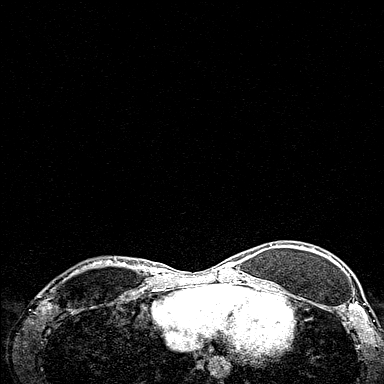
[im 58/144]
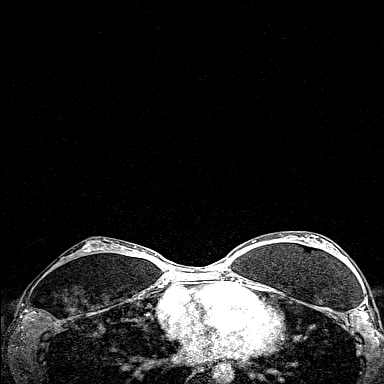
[im 86/144]
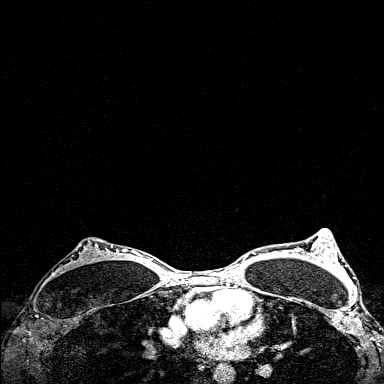
[im 115/144]
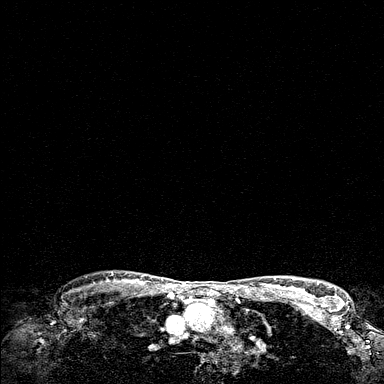
[im 144/144]
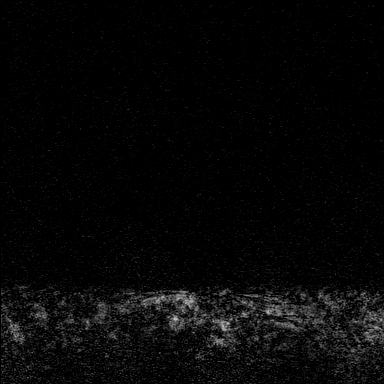

[Series 14: fl3d post-cm 3min_sub · axial · 1.2mm · 0.89mm/px · z∈[-59,-25]mm · 2 of 144 slices shown]
[im 1/144]
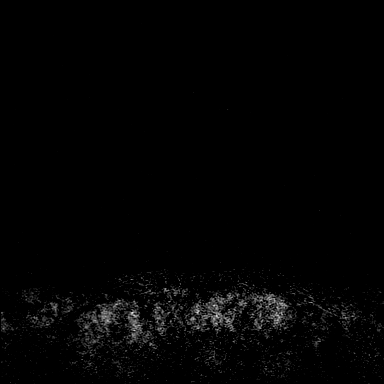
[im 29/144]
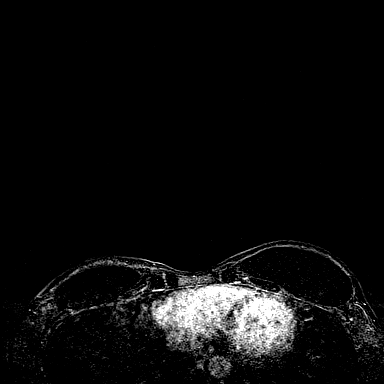

[32 of 48 positions shown; findings below may reference images not displayed]

Three-dimensional MR images were rendered by post-processing of the
original MR data on an independent workstation. The
three-dimensional MR images were interpreted, and findings are
reported in the following complete MRI report for this study. Three
dimensional images were evaluated at the independent interpreting
workstation using the DynaCAD thin client.
FINDINGS: Breast composition: d. Extreme fibroglandular tissue.

Background parenchymal enhancement: Moderate.

Right breast: Retropectoral saline implant. No mass or abnormal
enhancement. Less pronounced non mass enhancement in the
retroareolar region of the RIGHT breast, consistent with benign
breast tissue.

Left breast: Retropectoral saline implant. No mass or abnormal
enhancement.

Lymph nodes: No abnormal appearing lymph nodes.

Ancillary findings:  None.
IMPRESSION: No MRI evidence for malignancy in either breast. No further
follow-up is needed for the non mass enhancement in the retroareolar
region of the RIGHT breast.

Bilateral retropectoral saline implants.

RECOMMENDATION:
Recommend screening mammogram in [DATE].

Based on the recommendations of the [employer], annual
screening MRI is suggested in addition to annual mammography if the
patient has an estimated lifetime risk of developing breast cancer
which is greater than 20%.

BI-RADS CATEGORY  1: Negative.

## 2021-05-18 MED ORDER — GADOBUTROL 1 MMOL/ML IV SOLN
8.0000 mL | Freq: Once | INTRAVENOUS | Status: AC | PRN
  Administered 2021-05-18: 8 mL via INTRAVENOUS

## 2021-10-26 ENCOUNTER — Other Ambulatory Visit: Payer: Self-pay | Admitting: General Surgery

## 2021-10-26 DIAGNOSIS — Z1501 Genetic susceptibility to malignant neoplasm of breast: Secondary | ICD-10-CM

## 2022-01-08 ENCOUNTER — Ambulatory Visit
Admission: RE | Admit: 2022-01-08 | Discharge: 2022-01-08 | Disposition: A | Discharge status: Home / Self Care | Payer: BC Managed Care – PPO | Source: Ambulatory Visit | Attending: General Surgery | Admitting: General Surgery

## 2022-01-08 DIAGNOSIS — Z1502 Genetic susceptibility to malignant neoplasm of ovary: Secondary | ICD-10-CM

## 2022-04-30 ENCOUNTER — Other Ambulatory Visit: Payer: Self-pay | Admitting: General Surgery

## 2022-04-30 DIAGNOSIS — Z1501 Genetic susceptibility to malignant neoplasm of breast: Secondary | ICD-10-CM

## 2022-04-30 DIAGNOSIS — R928 Other abnormal and inconclusive findings on diagnostic imaging of breast: Secondary | ICD-10-CM

## 2023-12-09 ENCOUNTER — Encounter: Payer: Self-pay | Admitting: Gastroenterology

## 2024-02-01 ENCOUNTER — Encounter: Payer: Self-pay | Admitting: Gastroenterology

## 2024-02-01 ENCOUNTER — Ambulatory Visit (INDEPENDENT_AMBULATORY_CARE_PROVIDER_SITE_OTHER): Admitting: Gastroenterology

## 2024-02-01 VITALS — BP 90/64 | HR 80 | Ht 70.0 in | Wt 146.1 lb

## 2024-02-01 DIAGNOSIS — Z83719 Family history of colon polyps, unspecified: Secondary | ICD-10-CM

## 2024-02-01 DIAGNOSIS — K5900 Constipation, unspecified: Secondary | ICD-10-CM | POA: Diagnosis not present

## 2024-02-01 DIAGNOSIS — Z1589 Genetic susceptibility to other disease: Secondary | ICD-10-CM | POA: Diagnosis not present

## 2024-02-01 DIAGNOSIS — Z1211 Encounter for screening for malignant neoplasm of colon: Secondary | ICD-10-CM

## 2024-02-01 MED ORDER — NA SULFATE-K SULFATE-MG SULF 17.5-3.13-1.6 GM/177ML PO SOLN: 1.0000 | Freq: Once | ORAL | 0 refills | Status: AC

## 2024-02-01 NOTE — Patient Instructions (Signed)

## 2024-02-01 NOTE — Progress Notes (Signed)
 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  She gets mammograms/MRI at [facility] annually, has family history of breast cancer.  Due for initial colonoscopy at age 2 and recommendation is for screening every 5 years.   Discussed the use of AI scribe software for clinical note transcription with the patient, who gave verbal consent to proceed.  History of Present Illness Evanna Washinton is a 38 year old female who presents for early colonoscopy screening due to a increased risk for colon cancer.  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 - Approaching 39th birthday in January and desires to initiate colonoscopy screening at age 7 - Family history of breast cancer, no family history of colon cancer - Father had colonic polyps removed, which were benign  Bowel habits and gastrointestinal symptoms - Intermittent constipation, attributed to dairy intake and stress - History of severe constipation during dental school; currently experiences occasional constipation, not debilitating - Bowel movements typically once daily; occasionally skips a day or two, usually resolves without intervention - Previous use of Miralax  with good effect; currently manages symptoms with a healthy diet - No abdominal pain, hematochezia, melena, or upper gastrointestinal symptoms such as nausea, vomiting, or acid reflux, except for mild acid  reflux during pregnancy  Medication and supplement use - No current medications, including anticoagulants - Takes only vitamins   Previous GI Procedures/Imaging      Past Medical History:  Diagnosis Date   CHEK2 gene mutation positive     Past Surgical History:  Procedure Laterality Date   BREAST ENHANCEMENT SURGERY     CESAREAN SECTION N/A 06/09/2018   Procedure: CESAREAN SECTION;  Surgeon: Latisha Medford, MD;  Location: [facility] LD ORS;  Service: Obstetrics;  Laterality: N/A;  Primary edc 06/15/18 NKDA  need RNFA   LASIK     PERINEOPLASTY N/A 12/18/2015   Procedure: Perineal revision of laceration separation;  Surgeon: Burnard Bowers, MD;  Location: [facility] ORS;  Service: Gynecology;  Laterality: N/A;   WISDOM TOOTH EXTRACTION      Current Outpatient Medications  Medication Sig Dispense Refill   acetaminophen  (TYLENOL ) 325 MG tablet Take 2 tablets (650 mg total) by mouth every 6 (six) hours as needed for mild pain. 60 tablet 1   Cholecalciferol (VITAMIN D3 PO) Take 1 tablet by mouth daily.     ibuprofen  (ADVIL ,MOTRIN ) 600 MG tablet Take 1 tablet (600 mg total) by mouth every 6 (six) hours as needed for mild pain. 60 tablet 1   levonorgestrel (KYLEENA) 19.5 MG IUD 1 each by Intrauterine route once.     Multiple Vitamin (MULTIVITAMIN) tablet Take 1 tablet by mouth daily.     Omega-3 Fatty Acids (FISH OIL PO) Take 1 capsule by mouth as needed.     No current facility-administered medications for this visit.    Allergies as of 02/01/2024   (No Known Allergies)    Family History  Problem Relation Age of Onset   Multiple sclerosis Father  Prostate cancer Father    Kidney cancer Father    Cancer Maternal Grandmother        parotid gland   Breast cancer Paternal Grandmother    Lung cancer Paternal Grandfather     Social History   Tobacco Use   Smoking status: Never   Smokeless tobacco: Never  Vaping Use   Vaping status: Never Used  Substance Use Topics   Alcohol use:  No   Drug use: No     Review of Systems:    Constitutional: No unintentional weight loss, fever, chills Cardiovascular: No chest pain Respiratory: No SOB  Gastrointestinal: See HPI and otherwise negative   Physical Exam:  Vital signs: BP 90/64 (BP Location: Left Arm, Patient Position: Sitting, Cuff Size: Normal)   Pulse 80   Ht 5' 10 (1.778 m) Comment: height measured without shoes  Wt 146 lb 2 oz (66.3 kg)   LMP 01/12/2024   Breastfeeding No   BMI 20.97 kg/m   Constitutional: Pleasant, well-appearing female in NAD, alert and cooperative Head:  Normocephalic and atraumatic.  Eyes: No scleral icterus.  Respiratory: Respirations even and unlabored. Lungs clear to auscultation bilaterally.  No wheezes, crackles, or rhonchi.  Cardiovascular:  Regular rate and rhythm. No murmurs. No peripheral edema. Gastrointestinal:  Soft, nondistended, nontender. No rebound or guarding. Normal bowel sounds. No appreciable masses or hepatomegaly. Rectal:  Not performed.  Neurologic:  Alert and oriented x4;  grossly normal neurologically.  Skin:   Dry and intact without significant lesions or rashes. Psychiatric: Oriented to person, place and time. Demonstrates good judgement and reason without abnormal affect or behaviors.   RELEVANT LABS AND IMAGING: CBC    Component Value Date/Time   WBC 6.4 01/13/2019 0907   RBC 3.72 (L) 01/13/2019 0907   HGB 10.6 (L) 01/13/2019 0907   HCT 33.8 (L) 01/13/2019 0907   PLT 205 01/13/2019 0907   MCV 90.9 01/13/2019 0907   MCH 28.5 01/13/2019 0907   MCHC 31.4 01/13/2019 0907   RDW 14.3 01/13/2019 0907   LYMPHSABS 1.9 01/13/2019 0907   MONOABS 0.5 01/13/2019 0907   EOSABS 0.0 01/13/2019 0907   BASOSABS 0.0 01/13/2019 0907    CMP     Component Value Date/Time   NA 139 01/13/2019 0907   K 3.6 01/13/2019 0907   CL 111 01/13/2019 0907   CO2 22 01/13/2019 0907   GLUCOSE 91 01/13/2019 0907   BUN 12 01/13/2019 0907   CREATININE 0.51 01/13/2019 0907    CALCIUM 7.9 (L) 01/13/2019 0907   PROT 5.9 (L) 01/13/2019 0907   ALBUMIN 3.2 (L) 01/13/2019 0907   AST 16 01/13/2019 0907   ALT 16 01/13/2019 0907   ALKPHOS 46 01/13/2019 0907   BILITOT 0.7 01/13/2019 0907   GFRNONAA >60 01/13/2019 0907   GFRAA >60 01/13/2019 0907     Assessment/Plan:   Assessment & Plan Colorectal cancer screening CHEK2 gene mutation positive Family history of colon polyps Patient with family history of breast cancer.  Has had genetic screening and found to have CHEK2 gene mutation which is associated with high risk of breast cancer and elevated risk of colorectal cancer.  Recommendation is for initial colonoscopy at age 52 and screening every 5 years thereafter.  Does have family history of colon polyps in her father, reportedly benign.  No family history of colon cancer.  She has occasional constipation attributed to dairy intake and stress which is currently managed with diet and has previously used MiraLAX  with good effect  as needed.  - Scheduled colonoscopy for January once patient is 43 yoa. I thoroughly discussed the procedure with the patient to include nature of the procedure, alternatives, benefits, and risks (including but not limited to bleeding, infection, perforation, anesthesia/cardiac/pulmonary complications). Patient verbalized understanding and gave verbal consent to proceed with procedure.   Constipation Intermittent constipation likely related to dairy intake and stress, resolves with dietary changes and Miralax .  - Continue dietary modifications. - Use Miralax  as needed.   Camie Furbish, PA-C [facility] 02/01/2024, 9:19 AM  Patient Care Team: Patient, No Pcp Per as PCP - General (General Practice)

## 2024-02-02 ENCOUNTER — Encounter: Payer: Self-pay | Admitting: Gastroenterology

## 2024-04-20 ENCOUNTER — Ambulatory Visit: Admitting: Gastroenterology

## 2024-04-20 ENCOUNTER — Encounter: Payer: Self-pay | Admitting: Gastroenterology

## 2024-04-20 VITALS — BP 93/56 | HR 74 | Temp 98.0°F | Resp 19 | Ht 70.0 in | Wt 146.0 lb

## 2024-04-20 DIAGNOSIS — K635 Polyp of colon: Secondary | ICD-10-CM

## 2024-04-20 DIAGNOSIS — Z1211 Encounter for screening for malignant neoplasm of colon: Secondary | ICD-10-CM

## 2024-04-20 DIAGNOSIS — K648 Other hemorrhoids: Secondary | ICD-10-CM | POA: Diagnosis not present

## 2024-04-20 DIAGNOSIS — D120 Benign neoplasm of cecum: Secondary | ICD-10-CM

## 2024-04-20 DIAGNOSIS — K644 Residual hemorrhoidal skin tags: Secondary | ICD-10-CM

## 2024-04-20 DIAGNOSIS — Z1589 Genetic susceptibility to other disease: Secondary | ICD-10-CM

## 2024-04-20 MED ORDER — SODIUM CHLORIDE 0.9 % IV SOLN: 500.0000 mL | Freq: Once | INTRAVENOUS | Status: DC

## 2024-04-20 NOTE — Patient Instructions (Signed)
 Resume previous diet. Continue present medications. Awaiting pathology results. Repeat colonoscopy in 5 years for surveillance based on pathology results. Handouts provided on polyps and hemorrhoids.   YOU HAD AN ENDOSCOPIC PROCEDURE TODAY AT THE [facility]:   Refer to the procedure report that was given to you for any specific questions about what was found during the examination.  If the procedure report does not answer your questions, please call your gastroenterologist to clarify.  If you requested that your care partner not be given the details of your procedure findings, then the procedure report has been included in a sealed envelope for you to review at your convenience later.  YOU SHOULD EXPECT: Some feelings of bloating in the abdomen. Passage of more gas than usual.  Walking can help get rid of the air that was put into your GI tract during the procedure and reduce the bloating. If you had a lower endoscopy (such as a colonoscopy or flexible sigmoidoscopy) you may notice spotting of blood in your stool or on the toilet paper. If you underwent a bowel prep for your procedure, you may not have a normal bowel movement for a few days.  Please Note:  You might notice some irritation and congestion in your nose or some drainage.  This is from the oxygen used during your procedure.  There is no need for concern and it should clear up in a day or so.  SYMPTOMS TO REPORT IMMEDIATELY:  Following lower endoscopy (colonoscopy or flexible sigmoidoscopy):  Excessive amounts of blood in the stool  Significant tenderness or worsening of abdominal pains  Swelling of the abdomen that is new, acute  Fever of 100F or higher  For urgent or emergent issues, a gastroenterologist can be reached at any hour by calling [phone]. Do not use MyChart messaging for urgent concerns.    DIET:  We do recommend a small meal at first, but then you may proceed to your regular diet.  Drink plenty  of fluids but you should avoid alcoholic beverages for 24 hours.  ACTIVITY:  You should plan to take it easy for the rest of today and you should NOT DRIVE or use heavy machinery until tomorrow (because of the sedation medicines used during the test).    FOLLOW UP: Our staff will call the number listed on your records the next business day following your procedure.  We will call around 7:15- 8:00 am to check on you and address any questions or concerns that you may have regarding the information given to you following your procedure. If we do not reach you, we will leave a message.     If any biopsies were taken you will be contacted by phone or by letter within the next 1-3 weeks.  Please call us  at [phone] if you have not heard about the biopsies in 3 weeks.    SIGNATURES/CONFIDENTIALITY: You and/or your care partner have signed paperwork which will be entered into your electronic medical record.  These signatures attest to the fact that that the information above on your After Visit Summary has been reviewed and is understood.  Full responsibility of the confidentiality of this discharge information lies with you and/or your care-partner.

## 2024-04-20 NOTE — Progress Notes (Signed)
 Report to PACU, RN, vss, BBS= Clear.

## 2024-04-20 NOTE — Op Note (Signed)
 [facility] Patient Name: Rebekah Mclaughlin Procedure Date: 04/20/2024 10:52 AM MRN: [id number] Endoscopist: Gustav ALONSO Mcgee , MD, [id number] Age: 39 Referring MD:  Date of Birth: Jul 14, 1985 Gender: Female Account #: [id number] Procedure:                Colonoscopy Indications:              Family history of hereditary nonpolyposis                            colorectal cancer in a first-degree relative, CHEK                            2 mutation positive Medicines:                Monitored Anesthesia Care Procedure:                Pre-Anesthesia Assessment:                           - Prior to the procedure, a History and Physical                            was performed, and patient medications and                            allergies were reviewed. The patient's tolerance of                            previous anesthesia was also reviewed. The risks                            and benefits of the procedure and the sedation                            options and risks were discussed with the patient.                            All questions were answered, and informed consent                            was obtained. Prior Anticoagulants: The patient has                            taken no anticoagulant or antiplatelet agents. ASA                            Grade Assessment: I - A normal, healthy patient.                            After reviewing the risks and benefits, the patient                            was deemed in satisfactory condition to undergo the  procedure.                           After obtaining informed consent, the colonoscope                            was passed under direct vision. Throughout the                            procedure, the patient's blood pressure, pulse, and                            oxygen saturations were monitored continuously. The                            Olympus Scope [id number] was introduced through the                             anus and advanced to the the cecum, identified by                            appendiceal orifice and ileocecal valve. The                            colonoscopy was performed without difficulty. The                            patient tolerated the procedure well. The quality                            of the bowel preparation was adequate. The                            ileocecal valve, appendiceal orifice, and rectum                            were photographed. Scope In: 10:57:26 AM Scope Out: 11:13:38 AM Scope Withdrawal Time: 0 hours 8 minutes 41 seconds  Total Procedure Duration: 0 hours 16 minutes 12 seconds  Findings:                 The perianal and digital rectal examinations were                            normal.                           A 7 mm polyp was found in the cecum. The polyp was                            mucous-capped and sessile. The polyp was removed                            with a cold snare. Resection and retrieval were  complete.                           Non-bleeding external and internal hemorrhoids were                            found during retroflexion. The hemorrhoids were                            small. Complications:            No immediate complications. Estimated Blood Loss:     Estimated blood loss was minimal. Impression:               - One 7 mm polyp in the cecum, removed with a cold                            snare. Resected and retrieved.                           - Non-bleeding external and internal hemorrhoids. Recommendation:           - Patient has a contact number available for                            emergencies. The signs and symptoms of potential                            delayed complications were discussed with the                            patient. Return to normal activities tomorrow.                            Written discharge instructions were provided to the                             patient.                           - Resume previous diet.                           - Continue present medications.                           - Await pathology results.                           - Repeat colonoscopy in 5 years for surveillance                            based on pathology results. Argelio Granier V. Rebekah Norsworthy, MD 04/20/2024 11:19:22 AM This report has been signed electronically.

## 2024-04-20 NOTE — Progress Notes (Signed)
 [facility] History and Physical   Primary Care Physician:  Patient, No Pcp Per   Reason for Procedure:  Colorectal cancer screening for positive CHEK 2 mutation  Plan:    Screening colonoscopy with possible interventions as needed     HPI: Rebekah Mclaughlin is a very pleasant 39 y.o. female here for screening colonoscopy for positive CHEK 2 mutation. Denies any nausea, vomiting, abdominal pain, melena or bright red blood per rectum  The risks and benefits as well as alternatives of endoscopic procedure(s) have been discussed and reviewed.  The patient was provided an opportunity to ask questions and all were answered. The patient agreed with the plan and demonstrated an understanding of the instructions.   Past Medical History:  Diagnosis Date   CHEK2 gene mutation positive     Past Surgical History:  Procedure Laterality Date   BREAST ENHANCEMENT SURGERY     CESAREAN SECTION N/A 06/09/2018   Procedure: CESAREAN SECTION;  Surgeon: Latisha Medford, MD;  Location: [facility] LD ORS;  Service: Obstetrics;  Laterality: N/A;  Primary edc 06/15/18 NKDA  need RNFA   LASIK     PERINEOPLASTY N/A 12/18/2015   Procedure: Perineal revision of laceration separation;  Surgeon: Burnard Bowers, MD;  Location: [facility] ORS;  Service: Gynecology;  Laterality: N/A;   WISDOM TOOTH EXTRACTION      Prior to Admission medications  Medication Sig Start Date End Date Taking? Authorizing Provider  Cholecalciferol (VITAMIN D3 PO) Take 1 tablet by mouth daily.   Yes [provider]  levonorgestrel (KYLEENA) 19.5 MG IUD 1 each by Intrauterine route once. 07/26/18  Yes [provider]  Omega-3 Fatty Acids (FISH OIL PO) Take 1 capsule by mouth as needed.   Yes [provider]  acetaminophen  (TYLENOL ) 325 MG tablet Take 2 tablets (650 mg total) by mouth every 6 (six) hours as needed for mild pain. 06/10/18   Tomblin, James, MD  ibuprofen  (ADVIL ,MOTRIN ) 600 MG tablet Take 1 tablet  (600 mg total) by mouth every 6 (six) hours as needed for mild pain. 06/10/18   Tomblin, James, MD  Multiple Vitamin (MULTIVITAMIN) tablet Take 1 tablet by mouth daily. Patient not taking: Reported on 04/20/2024    [provider]    Current Outpatient Medications  Medication Sig Dispense Refill   Cholecalciferol (VITAMIN D3 PO) Take 1 tablet by mouth daily.     levonorgestrel (KYLEENA) 19.5 MG IUD 1 each by Intrauterine route once.     Omega-3 Fatty Acids (FISH OIL PO) Take 1 capsule by mouth as needed.     acetaminophen  (TYLENOL ) 325 MG tablet Take 2 tablets (650 mg total) by mouth every 6 (six) hours as needed for mild pain. 60 tablet 1   ibuprofen  (ADVIL ,MOTRIN ) 600 MG tablet Take 1 tablet (600 mg total) by mouth every 6 (six) hours as needed for mild pain. 60 tablet 1   Multiple Vitamin (MULTIVITAMIN) tablet Take 1 tablet by mouth daily. (Patient not taking: Reported on 04/20/2024)     Current Facility-Administered Medications  Medication Dose Route Frequency Provider Last Rate Last Admin   0.9 %  sodium chloride  infusion  500 mL Intravenous Once Aleen Marston V, MD        Allergies as of 04/20/2024   (No Known Allergies)    Family History  Problem Relation Age of Onset   Multiple sclerosis Father    Prostate cancer Father    Kidney cancer Father    Cancer Maternal Grandmother  parotid gland   Breast cancer Paternal Grandmother    Lung cancer Paternal Grandfather    Colon cancer Neg Hx    Rectal cancer Neg Hx     Social History   Socioeconomic History   Marital status: Married    Spouse name: Not on file   Number of children: 2   Years of education: Not on file   Highest education level: Not on file  Occupational History   Occupation: orthodontist  Tobacco Use   Smoking status: Never   Smokeless tobacco: Never  Vaping Use   Vaping status: Never Used  Substance and Sexual Activity   Alcohol use: No   Drug use: No   Sexual activity: Not  Currently    Birth control/protection: None  Other Topics Concern   Not on file  Social History Narrative   Not on file   Social Drivers of Health   Tobacco Use: Low Risk (04/20/2024)   Patient History    Smoking Tobacco Use: Never    Smokeless Tobacco Use: Never    Passive Exposure: Not on file  Financial Resource Strain: Not on file  Food Insecurity: Not on file  Transportation Needs: Not on file  Physical Activity: Not on file  Stress: Not on file  Social Connections: Not on file  Intimate Partner Violence: Not on file  Depression (EYV7-0): Not on file  Alcohol Screen: Not on file  Housing: Unknown (08/01/2023)   Received from [facility]   Epic    Unable to Pay for Housing in the Last Year: Not on file    Number of Times Moved in the Last Year: Not on file    At any time in the past 12 months, were you homeless or living in a shelter (including now)?: No  Utilities: Not on file  Health Literacy: Not on file    Review of Systems:  All other review of systems negative except as mentioned in the HPI.  Physical Exam: Vital signs in last 24 hours: BP (!) 99/57   Pulse 80   Temp 98 F (36.7 C)   Ht 5' 10 (1.778 m)   Wt 146 lb (66.2 kg)   SpO2 100%   BMI 20.95 kg/m  General:   Alert, NAD Lungs:  Clear .   Heart:  Regular rate and rhythm Abdomen:  Soft, nontender and nondistended. Neuro/Psych:  Alert and cooperative. Normal mood and affect. A and O x 3  Reviewed labs, radiology imaging, old records and pertinent past GI work up  Patient is appropriate for planned procedure(s) and anesthesia in an ambulatory setting   K. Veena Tyasia Packard , MD [phone]

## 2024-04-24 ENCOUNTER — Telehealth: Payer: Self-pay

## 2024-04-24 NOTE — Telephone Encounter (Signed)
 Follow up call to pt, lm for pt to call if having any difficulty with normal activities or eating and drinking.  Also to call if any other questions or concerns.

## 2024-04-25 LAB — SURGICAL PATHOLOGY
